# Patient Record
Sex: Male | Born: 1960 | Race: White | Hispanic: No | Marital: Married | Smoking: Former smoker
Health system: Southern US, Community
[De-identification: ages and names within clinical notes are randomized; demographics above are authoritative.]

## PROBLEM LIST (undated history)

## (undated) DIAGNOSIS — R112 Nausea with vomiting, unspecified: Secondary | ICD-10-CM

## (undated) DIAGNOSIS — R0600 Dyspnea, unspecified: Secondary | ICD-10-CM

## (undated) DIAGNOSIS — Z9889 Other specified postprocedural states: Secondary | ICD-10-CM

## (undated) DIAGNOSIS — C801 Malignant (primary) neoplasm, unspecified: Secondary | ICD-10-CM

## (undated) HISTORY — PX: APPENDECTOMY: SHX54

## (undated) HISTORY — PX: HIP SURGERY: SHX245

## (undated) HISTORY — PX: WISDOM TOOTH EXTRACTION: SHX21

## (undated) HISTORY — PX: SHOULDER SURGERY: SHX246

## (undated) HISTORY — PX: INGUINAL HERNIA REPAIR: SHX194

---

## 2019-03-27 ENCOUNTER — Other Ambulatory Visit: Payer: Self-pay

## 2019-03-27 ENCOUNTER — Encounter: Payer: Self-pay | Admitting: Internal Medicine

## 2019-03-27 ENCOUNTER — Ambulatory Visit: Payer: Managed Care, Other (non HMO) | Admitting: Internal Medicine

## 2019-03-27 DIAGNOSIS — C3490 Malignant neoplasm of unspecified part of unspecified bronchus or lung: Secondary | ICD-10-CM

## 2019-03-27 MED ORDER — BREO ELLIPTA 100-25 MCG/INH IN AEPB: 1.0000 | INHALATION_SPRAY | Freq: Every day | RESPIRATORY_TRACT | 0 refills | Status: AC

## 2019-03-27 MED ORDER — CEFAZOLIN SODIUM-DEXTROSE 2-4 GM/100ML-% IV SOLN: 2.0000 g | INTRAVENOUS | Status: DC

## 2019-03-27 NOTE — Patient Instructions (Signed)
COVID Screen Bronchoscopy/biopsy on Tuesday I will call you with results and next steps PET scan ordered, they will call you to schedule

## 2019-03-27 NOTE — H&P (View-Only) (Signed)
[id number]  Synopsis: Referred in 03/27/19 for lung mass by Glenda Chroman, MD  Subjective:   PATIENT ID: George Mullen GENDER: male DOB: [birth year], MRN: [id number]  Chief Complaint  Patient presents with  . Consult    Patient is here for consult for shortness or breath, cough for 3 months and lung mass.    HPI 59 year old smoker presenting with persistent cough and dyspnea x 3 months found to have lung mass on imaging.  Initial trials of steroids and antibiotics ineffective leading to CT scan.  Pulmonology is consulted to assist with workup.  Quit smoking last week, 1.5 ppd x 30 years prior to that  ROS as below.  No B symptoms. Cough is productive of clear sputum, no blood.  Systemic steroids and OTC cough meds don't seem to help it.  ROS Positive Symptoms in bold:  Constitutional fevers, chills, weight loss, fatigue, anorexia, malaise  Eyes decreased vision, double vision, eye irritation  Ears, Nose, Mouth, Throat sore throat, trouble swallowing, sinus congestion  Cardiovascular chest pain, paroxysmal nocturnal dyspnea, lower ext edema, palpitations   Respiratory SOB, cough, DOE, hemoptysis, wheezing  Gastrointestinal nausea, vomiting, diarrhea  Genitourinary burning with urination, trouble urinating  Musculoskeletal joint aches, joint swelling, back pain  Integumentary  rashes, skin lesions  Neurological focal weakness, focal numbness, trouble speaking, headaches  Psychiatric depression, anxiety, confusion  Endocrine polyuria, polydipsia, cold intolerance, heat intolerance  Hematologic abnormal bruising, abnormal bleeding, unexplained nose bleeds  Allergic/Immunologic recurrent infections, hives, swollen lymph nodes    History reviewed. No pertinent past medical history.   Family History  Problem Relation Age of Onset  . COPD Mother   . Lung cancer Maternal Grandmother   . Stomach cancer Paternal Grandmother      Past Surgical History:  Procedure Laterality Date  .  APPENDECTOMY    . HIP SURGERY Right   . INGUINAL HERNIA REPAIR    . SHOULDER SURGERY Right     Social History   Socioeconomic History  . Marital status: Married    Spouse name: Not on file  . Number of children: Not on file  . Years of education: Not on file  . Highest education level: Not on file  Occupational History  . Not on file  Tobacco Use  . Smoking status: Former Smoker    Types: Cigarettes    Quit date: 03/18/2019    Years since quitting: 0.0  . Smokeless tobacco: Never Used  Substance and Sexual Activity  . Alcohol use: Yes    Comment: Rare  . Drug use: Never  . Sexual activity: Not on file  Other Topics Concern  . Not on file  Social History Narrative  . Not on file   Social Determinants of Health   Financial Resource Strain:   . Difficulty of Paying Living Expenses: Not on file  Food Insecurity:   . Worried About Charity fundraiser in the Last Year: Not on file  . Ran Out of Food in the Last Year: Not on file  Transportation Needs:   . Lack of Transportation (Medical): Not on file  . Lack of Transportation (Non-Medical): Not on file  Physical Activity:   . Days of Exercise per Week: Not on file  . Minutes of Exercise per Session: Not on file  Stress:   . Feeling of Stress : Not on file  Social Connections:   . Frequency of Communication with Friends and Family: Not on file  . Frequency of Social  Gatherings with Friends and Family: Not on file  . Attends Religious Services: Not on file  . Active Member of Clubs or Organizations: Not on file  . Attends Archivist Meetings: Not on file  . Marital Status: Not on file  Intimate Partner Violence:   . Fear of Current or Ex-Partner: Not on file  . Emotionally Abused: Not on file  . Physically Abused: Not on file  . Sexually Abused: Not on file     Not on File   No outpatient medications prior to visit.   No facility-administered medications prior to visit.     Objective:  GEN:  middle aged man in NAD HEENT: trachea midline, MMM CV: RRR, ext warm PULM: diminished bilaterally, no wheezing GI: Soft, +BS  EXT: No edema NEURO: Moves all 4 ext to command PSYCH: AOx3, good insight SKIN: no rashes   Vitals:   03/27/19 1136  BP: 120/80  Pulse: 75  Temp: (!) 97.2 F (36.2 C)  TempSrc: Temporal  SpO2: 97%  Weight: 160 lb (72.6 kg)  Height: 5\' 8"  (1.727 m)   97% on RA BMI Readings from Last 3 Encounters:  03/27/19 24.33 kg/m   Wt Readings from Last 3 Encounters:  03/27/19 160 lb (72.6 kg)     CBC No results found for: WBC, RBC, HGB, HCT, PLT, MCV, MCH, MCHC, RDW, LYMPHSABS, MONOABS, EOSABS, BASOSABS     Assessment & Plan:  # LUL masses, 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 TCTS referral depending on above results - Trial of breo for cough  Spent great than 60 minutes on this patient's care greater than 50% of which was spent face-to-face.  All questions answered.  No current outpatient medications on file.   Candee Furbish, MD [facility] 03/27/2019 12:09 PM

## 2019-03-27 NOTE — Progress Notes (Signed)
[id number]  Synopsis: Referred in 03/27/19 for lung mass by Glenda Chroman, MD  Subjective:   PATIENT ID: George Mullen GENDER: male DOB: 07-28-1960, MRN: [id number]  Chief Complaint  Patient presents with  . Consult    Patient is here for consult for shortness or breath, cough for 3 months and lung mass.    HPI 59 year old smoker presenting with persistent cough and dyspnea x 3 months found to have lung mass on imaging.  Initial trials of steroids and antibiotics ineffective leading to CT scan.  Pulmonology is consulted to assist with workup.  Quit smoking last week, 1.5 ppd x 30 years prior to that  ROS as below.  No B symptoms. Cough is productive of clear sputum, no blood.  Systemic steroids and OTC cough meds don't seem to help it.  ROS Positive Symptoms in bold:  Constitutional fevers, chills, weight loss, fatigue, anorexia, malaise  Eyes decreased vision, double vision, eye irritation  Ears, Nose, Mouth, Throat sore throat, trouble swallowing, sinus congestion  Cardiovascular chest pain, paroxysmal nocturnal dyspnea, lower ext edema, palpitations   Respiratory SOB, cough, DOE, hemoptysis, wheezing  Gastrointestinal nausea, vomiting, diarrhea  Genitourinary burning with urination, trouble urinating  Musculoskeletal joint aches, joint swelling, back pain  Integumentary  rashes, skin lesions  Neurological focal weakness, focal numbness, trouble speaking, headaches  Psychiatric depression, anxiety, confusion  Endocrine polyuria, polydipsia, cold intolerance, heat intolerance  Hematologic abnormal bruising, abnormal bleeding, unexplained nose bleeds  Allergic/Immunologic recurrent infections, hives, swollen lymph nodes    History reviewed. No pertinent past medical history.   Family History  Problem Relation Age of Onset  . COPD Mother   . Lung cancer Maternal Grandmother   . Stomach cancer Paternal Grandmother      Past Surgical History:  Procedure Laterality Date  .  APPENDECTOMY    . HIP SURGERY Right   . INGUINAL HERNIA REPAIR    . SHOULDER SURGERY Right     Social History   Socioeconomic History  . Marital status: Married    Spouse name: Not on file  . Number of children: Not on file  . Years of education: Not on file  . Highest education level: Not on file  Occupational History  . Not on file  Tobacco Use  . Smoking status: Former Smoker    Types: Cigarettes    Quit date: 03/18/2019    Years since quitting: 0.0  . Smokeless tobacco: Never Used  Substance and Sexual Activity  . Alcohol use: Yes    Comment: Rare  . Drug use: Never  . Sexual activity: Not on file  Other Topics Concern  . Not on file  Social History Narrative  . Not on file   Social Determinants of Health   Financial Resource Strain:   . Difficulty of Paying Living Expenses: Not on file  Food Insecurity:   . Worried About Charity fundraiser in the Last Year: Not on file  . Ran Out of Food in the Last Year: Not on file  Transportation Needs:   . Lack of Transportation (Medical): Not on file  . Lack of Transportation (Non-Medical): Not on file  Physical Activity:   . Days of Exercise per Week: Not on file  . Minutes of Exercise per Session: Not on file  Stress:   . Feeling of Stress : Not on file  Social Connections:   . Frequency of Communication with Friends and Family: Not on file  . Frequency of Social  Gatherings with Friends and Family: Not on file  . Attends Religious Services: Not on file  . Active Member of Clubs or Organizations: Not on file  . Attends Archivist Meetings: Not on file  . Marital Status: Not on file  Intimate Partner Violence:   . Fear of Current or Ex-Partner: Not on file  . Emotionally Abused: Not on file  . Physically Abused: Not on file  . Sexually Abused: Not on file     Not on File   No outpatient medications prior to visit.   No facility-administered medications prior to visit.     Objective:  GEN:  middle aged man in NAD HEENT: trachea midline, MMM CV: RRR, ext warm PULM: diminished bilaterally, no wheezing GI: Soft, +BS  EXT: No edema NEURO: Moves all 4 ext to command PSYCH: AOx3, good insight SKIN: no rashes   Vitals:   03/27/19 1136  BP: 120/80  Pulse: 75  Temp: (!) 97.2 F (36.2 C)  TempSrc: Temporal  SpO2: 97%  Weight: 160 lb (72.6 kg)  Height: 5\' 8"  (1.727 m)   97% on RA BMI Readings from Last 3 Encounters:  03/27/19 24.33 kg/m   Wt Readings from Last 3 Encounters:  03/27/19 160 lb (72.6 kg)     CBC No results found for: WBC, RBC, HGB, HCT, PLT, MCV, MCH, MCHC, RDW, LYMPHSABS, MONOABS, EOSABS, BASOSABS     Assessment & Plan:  # LUL masses, 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 TCTS referral depending on above results - Trial of breo for cough  Spent great than 60 minutes on this patient's care greater than 50% of which was spent face-to-face.  All questions answered.  No current outpatient medications on file.   Candee Furbish, MD [facility] 03/27/2019 12:09 PM

## 2019-03-29 ENCOUNTER — Other Ambulatory Visit: Payer: Self-pay

## 2019-03-29 ENCOUNTER — Encounter (HOSPITAL_COMMUNITY): Payer: Self-pay | Admitting: Internal Medicine

## 2019-03-29 NOTE — Progress Notes (Signed)
Patient denies shortness of breath, fever, cough and chest pain.  PCP - Dr Dian Situ Dyas Cardiologist - denies  Chest x-ray - denies EKG - 04/02/19 DOS Stress Test - denies ECHO - denies Cardiac Cath - denies  STOP now taking any Aspirin (unless otherwise instructed by your surgeon), Aleve, Naproxen, Ibuprofen, Motrin, Advil, Goody's, BC's, all herbal medications, fish oil, and all vitamins.   Coronavirus Screening Have you experienced the following symptoms:  Cough yes/no: No Fever (>100.70F)  yes/no: No Runny nose yes/no: No Sore throat yes/no: No Difficulty breathing/shortness of breath  yes/no: No  Have you traveled in the last 14 days and where? yes/no: No  Patient verbalized understanding of instructions that were given via phone.

## 2019-03-30 ENCOUNTER — Other Ambulatory Visit (HOSPITAL_COMMUNITY)
Admission: RE | Admit: 2019-03-30 | Discharge: 2019-03-30 | Disposition: A | Discharge status: Home / Self Care | Payer: Managed Care, Other (non HMO) | Source: Ambulatory Visit | Attending: Internal Medicine | Admitting: Internal Medicine

## 2019-03-30 DIAGNOSIS — Z20822 Contact with and (suspected) exposure to covid-19: Secondary | ICD-10-CM | POA: Diagnosis not present

## 2019-03-30 DIAGNOSIS — Z01812 Encounter for preprocedural laboratory examination: Secondary | ICD-10-CM | POA: Insufficient documentation

## 2019-03-31 LAB — SARS CORONAVIRUS 2 (TAT 6-24 HRS): SARS Coronavirus 2: NEGATIVE

## 2019-04-01 NOTE — Anesthesia Preprocedure Evaluation (Addendum)
Anesthesia Evaluation  Patient identified by MRN, date of birth, ID band Patient awake    Reviewed: Allergy & Precautions, NPO status , Patient's Chart, lab work & pertinent test results  History of Anesthesia Complications (+) PONV and history of anesthetic complications  Airway Mallampati: II  TM Distance: >3 FB Neck ROM: Full    Dental  (+) Teeth Intact, Dental Advisory Given   Pulmonary shortness of breath, former smoker,  Lung mass    Pulmonary exam normal breath sounds clear to auscultation       Cardiovascular negative cardio ROS Normal cardiovascular exam Rhythm:Regular Rate:Normal     Neuro/Psych negative neurological ROS  negative psych ROS   GI/Hepatic negative GI ROS, Neg liver ROS,   Endo/Other  negative endocrine ROS  Renal/GU negative Renal ROS     Musculoskeletal negative musculoskeletal ROS (+)   Abdominal   Peds  Hematology negative hematology ROS (+)   Anesthesia Other Findings Day of surgery medications reviewed with the patient.  Reproductive/Obstetrics                           Anesthesia Physical Anesthesia Plan  ASA: III  Anesthesia Plan: General   Post-op Pain Management:    Induction: Intravenous  PONV Risk Score and Plan: 2 and Dexamethasone and Ondansetron  Airway Management Planned: Oral ETT  Additional Equipment:   Intra-op Plan:   Post-operative Plan: Extubation in OR  Informed Consent: I have reviewed the patients History and Physical, chart, labs and discussed the procedure including the risks, benefits and alternatives for the proposed anesthesia with the patient or authorized representative who has indicated his/her understanding and acceptance.     Dental advisory given  Plan Discussed with: CRNA  Anesthesia Plan Comments:        Anesthesia Quick Evaluation

## 2019-04-02 ENCOUNTER — Other Ambulatory Visit: Payer: Self-pay

## 2019-04-02 ENCOUNTER — Telehealth: Payer: Self-pay | Admitting: Pulmonary Disease

## 2019-04-02 ENCOUNTER — Ambulatory Visit (HOSPITAL_COMMUNITY): Payer: Managed Care, Other (non HMO) | Admitting: Certified Registered"

## 2019-04-02 ENCOUNTER — Encounter (HOSPITAL_COMMUNITY)
Admission: RE | Disposition: A | Discharge status: Home / Self Care | Payer: Self-pay | Source: Home / Self Care | Attending: Internal Medicine

## 2019-04-02 ENCOUNTER — Encounter: Payer: Self-pay | Admitting: Pulmonary Disease

## 2019-04-02 ENCOUNTER — Ambulatory Visit (HOSPITAL_COMMUNITY)
Admission: RE | Admit: 2019-04-02 | Discharge: 2019-04-02 | Disposition: A | Discharge status: Home / Self Care | Payer: Managed Care, Other (non HMO) | Attending: Internal Medicine | Admitting: Internal Medicine

## 2019-04-02 ENCOUNTER — Encounter (HOSPITAL_COMMUNITY): Payer: Self-pay | Admitting: Internal Medicine

## 2019-04-02 DIAGNOSIS — C3490 Malignant neoplasm of unspecified part of unspecified bronchus or lung: Secondary | ICD-10-CM

## 2019-04-02 DIAGNOSIS — C3412 Malignant neoplasm of upper lobe, left bronchus or lung: Secondary | ICD-10-CM | POA: Insufficient documentation

## 2019-04-02 DIAGNOSIS — Z87891 Personal history of nicotine dependence: Secondary | ICD-10-CM | POA: Diagnosis not present

## 2019-04-02 DIAGNOSIS — R918 Other nonspecific abnormal finding of lung field: Secondary | ICD-10-CM | POA: Diagnosis present

## 2019-04-02 DIAGNOSIS — Z801 Family history of malignant neoplasm of trachea, bronchus and lung: Secondary | ICD-10-CM | POA: Insufficient documentation

## 2019-04-02 DIAGNOSIS — R05 Cough: Secondary | ICD-10-CM

## 2019-04-02 DIAGNOSIS — C771 Secondary and unspecified malignant neoplasm of intrathoracic lymph nodes: Secondary | ICD-10-CM | POA: Insufficient documentation

## 2019-04-02 DIAGNOSIS — R059 Cough, unspecified: Secondary | ICD-10-CM

## 2019-04-02 HISTORY — PX: BRONCHIAL NEEDLE ASPIRATION BIOPSY: SHX5106

## 2019-04-02 HISTORY — PX: ENDOBRONCHIAL ULTRASOUND: SHX5096

## 2019-04-02 HISTORY — DX: Dyspnea, unspecified: R06.00

## 2019-04-02 HISTORY — DX: Other specified postprocedural states: Z98.890

## 2019-04-02 HISTORY — DX: Other specified postprocedural states: R11.2

## 2019-04-02 HISTORY — PX: VIDEO BRONCHOSCOPY: SHX5072

## 2019-04-02 LAB — COMPREHENSIVE METABOLIC PANEL
ALT: 36 U/L (ref 0–44)
AST: 27 U/L (ref 15–41)
Albumin: 3.4 g/dL — ABNORMAL LOW (ref 3.5–5.0)
Alkaline Phosphatase: 59 U/L (ref 38–126)
Anion gap: 12 (ref 5–15)
BUN: 17 mg/dL (ref 6–20)
CO2: 22 mmol/L (ref 22–32)
Calcium: 9 mg/dL (ref 8.9–10.3)
Chloride: 105 mmol/L (ref 98–111)
Creatinine, Ser: 1.09 mg/dL (ref 0.61–1.24)
GFR calc Af Amer: >60 mL/min (ref >60)
GFR calc non Af Amer: >60 mL/min (ref >60)
Glucose, Bld: 96 mg/dL (ref 70–99)
Potassium: 3.9 mmol/L (ref 3.5–5.1)
Sodium: 139 mmol/L (ref 135–145)
Total Bilirubin: 0.4 mg/dL (ref 0.3–1.2)
Total Protein: 6 g/dL — ABNORMAL LOW (ref 6.5–8.1)

## 2019-04-02 LAB — TYPE AND SCREEN
ABO/RH(D): A POS
Antibody Screen: NEGATIVE

## 2019-04-02 LAB — CBC
HCT: 43.1 % (ref 39.0–52.0)
Hemoglobin: 14.2 g/dL (ref 13.0–17.0)
MCH: 29.8 pg (ref 26.0–34.0)
MCHC: 32.9 g/dL (ref 30.0–36.0)
MCV: 90.5 fL (ref 80.0–100.0)
Platelets: 316 10*3/uL (ref 150–400)
RBC: 4.76 MIL/uL (ref 4.22–5.81)
RDW: 12.4 % (ref 11.5–15.5)
WBC: 8 10*3/uL (ref 4.0–10.5)
nRBC: 0 % (ref 0.0–0.2)

## 2019-04-02 LAB — PROTIME-INR
INR: 1 (ref 0.8–1.2)
Prothrombin Time: 13.2 seconds (ref 11.4–15.2)

## 2019-04-02 LAB — ABO/RH: ABO/RH(D): A POS

## 2019-04-02 LAB — APTT: aPTT: 31 seconds (ref 24–36)

## 2019-04-02 SURGERY — VIDEO BRONCHOSCOPY WITHOUT FLUORO
Anesthesia: General

## 2019-04-02 MED ORDER — PROMETHAZINE HCL 25 MG/ML IJ SOLN: 6.2500 mg | INTRAMUSCULAR | Status: DC | PRN

## 2019-04-02 MED ORDER — LACTATED RINGERS IV SOLN: INTRAVENOUS | Status: DC | PRN

## 2019-04-02 MED ORDER — LIDOCAINE HCL (PF) 1 % IJ SOLN
INTRAMUSCULAR | Status: AC
  Filled 2019-04-02: qty 30

## 2019-04-02 MED ORDER — LIDOCAINE 2% (20 MG/ML) 5 ML SYRINGE
INTRAMUSCULAR | Status: DC | PRN
  Administered 2019-04-02: 100 mg via INTRAVENOUS

## 2019-04-02 MED ORDER — MIDAZOLAM HCL 5 MG/5ML IJ SOLN
INTRAMUSCULAR | Status: DC | PRN
  Administered 2019-04-02: 2 mg via INTRAVENOUS

## 2019-04-02 MED ORDER — LACTATED RINGERS IV SOLN: INTRAVENOUS | Status: DC

## 2019-04-02 MED ORDER — FENTANYL CITRATE (PF) 100 MCG/2ML IJ SOLN
INTRAMUSCULAR | Status: DC | PRN
  Administered 2019-04-02: 100 ug via INTRAVENOUS
  Administered 2019-04-02: 50 ug via INTRAVENOUS

## 2019-04-02 MED ORDER — ACETAMINOPHEN 500 MG PO TABS: 1000.0000 mg | ORAL_TABLET | Freq: Once | ORAL | Status: DC

## 2019-04-02 MED ORDER — PROPOFOL 10 MG/ML IV BOLUS
INTRAVENOUS | Status: DC | PRN
  Administered 2019-04-02: 150 mg via INTRAVENOUS

## 2019-04-02 MED ORDER — HYDROCODONE-HOMATROPINE 5-1.5 MG/5ML PO SYRP: 5.0000 mL | ORAL_SOLUTION | Freq: Four times a day (QID) | ORAL | 0 refills | Status: AC | PRN

## 2019-04-02 MED ORDER — DEXAMETHASONE SODIUM PHOSPHATE 10 MG/ML IJ SOLN
INTRAMUSCULAR | Status: DC | PRN
  Administered 2019-04-02: 10 mg via INTRAVENOUS

## 2019-04-02 MED ORDER — FENTANYL CITRATE (PF) 100 MCG/2ML IJ SOLN: 25.0000 ug | INTRAMUSCULAR | Status: DC | PRN

## 2019-04-02 MED ORDER — ROCURONIUM BROMIDE 50 MG/5ML IV SOSY
PREFILLED_SYRINGE | INTRAVENOUS | Status: DC | PRN
  Administered 2019-04-02: 50 mg via INTRAVENOUS

## 2019-04-02 MED ORDER — ALBUTEROL SULFATE (2.5 MG/3ML) 0.083% IN NEBU: 2.5000 mg | INHALATION_SOLUTION | Freq: Four times a day (QID) | RESPIRATORY_TRACT | 12 refills | Status: AC | PRN

## 2019-04-02 MED ORDER — SUGAMMADEX SODIUM 200 MG/2ML IV SOLN
INTRAVENOUS | Status: DC | PRN
  Administered 2019-04-02: 150 mg via INTRAVENOUS

## 2019-04-02 MED ORDER — ONDANSETRON HCL 4 MG/2ML IJ SOLN
INTRAMUSCULAR | Status: DC | PRN
  Administered 2019-04-02: 4 mg via INTRAVENOUS

## 2019-04-02 NOTE — Op Note (Signed)
Video Bronchoscopy with Endobronchial Ultrasound Procedure Note  Date of Operation: 04/02/2019  Pre-op Diagnosis: Adenopathy, Lung mass  Post-op Diagnosis: Adenopathy, Lung mass  Surgeon: Erskine Emery MD  Anesthesia: General endotracheal anesthesia  Operation: Flexible video fiberoptic bronchoscopy with endobronchial ultrasound and biopsies.  Estimated Blood Loss: Minimal  Complications: None immediate  Indications and History: George Mullen is a 59 y.o. male with lung mass and mediastinal adenopathy.  The risks, benefits, complications, treatment options and expected outcomes were discussed with the patient.  The possibilities of pneumothorax, pneumonia, reaction to medication, pulmonary aspiration, perforation of a viscus, bleeding, failure to diagnose a condition and creating a complication requiring transfusion or operation were discussed with the patient who freely signed the consent.    Description of Procedure: The patient was examined in the preoperative area and history and data from the preprocedure consultation were reviewed. It was deemed appropriate to proceed.  The patient was taken to endoscopy, identified as George Mullen and the procedure verified as Flexible Video Fiberoptic Bronchoscopy.  A Time Out was held and the above information confirmed. After being taken to the operating room general anesthesia was initiated and the patient  was orally intubated. 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   The standard scope was then withdrawn and the endobronchial ultrasound was advanced into airway.   Using real-time ultrasound guidance Wang needle biopsies were take from Station 10R/4R/7 nodes, LUL lung mass and were sent for cytology. The patient tolerated the procedure well without apparent complications. There was no  significant blood loss. The bronchoscope was withdrawn. Anesthesia was reversed and the patient was taken to the PACU for recovery.   Samples: 1. Wang needle biopsies from 10R node 2. Wang needle biopsies from 4R node 3. Wang needle biopsies from 7 node 4. Wang needle biopsies from LUL mass Plans:  The patient will be discharged from the PACU to home when recovered from anesthesia. We will review the cytology, pathology and microbiology results with the patient when they become available. Outpatient followup will be with Dr. Tamala Julian.      Erskine Emery MD [facility] 04/02/2019 8:02 AM Personal pager: [phone] If unanswered, please page CCM On-call: [phone]

## 2019-04-02 NOTE — Transfer of Care (Signed)
Immediate Anesthesia Transfer of Care Note  Patient: George Mullen  Procedure(s) Performed: VIDEO BRONCHOSCOPY WITHOUT FLUORO (N/A ) ENDOBRONCHIAL ULTRASOUND BRONCHIAL NEEDLE ASPIRATION BIOPSIES  Patient Location: PACU  Anesthesia Type:General  Level of Consciousness: awake, alert  and patient cooperative  Airway & Oxygen Therapy: Patient connected to face mask oxygen  Post-op Assessment: Post -op Vital signs reviewed and stable and Patient moving all extremities  Post vital signs: stable  Last Vitals:  Vitals Value Taken Time  BP    Temp    Pulse    Resp    SpO2      Last Pain:  Vitals:   04/02/19 0705  TempSrc:   PainSc: 2       Patients Stated Pain Goal: 4 (94/58/59 2924)  Complications: No apparent anesthesia complications

## 2019-04-02 NOTE — Discharge Instructions (Signed)
I will call you with results and next steps. You may have a slight fever tonight and cough up a little blood. If you are coughing up a lot of blood go to the ER.  Flexible Bronchoscopy, Care After This sheet gives you information about how to care for yourself after your test. Your doctor may also give you more specific instructions. If you have problems or questions, contact your doctor. Follow these instructions at home: Driving  Do not drive for 24 hours if you were given a medicine to help you relax (sedative).  Do not drive or use heavy machinery while taking prescription pain medicine. General instructions   Take over-the-counter and prescription medicines only as told by your doctor.  Return to your normal activities as told. Ask what activities are safe for you.  Do not use any products that have nicotine or tobacco in them. This includes cigarettes and e-cigarettes. If you need help quitting, ask your doctor.  Keep all follow-up visits as told by your doctor. This is important. It is very important if you had a tissue sample (biopsy) taken. Get help right away if:  You have shortness of breath that gets worse.  You get light-headed.  You feel like you are going to pass out (faint).  You have chest pain.  You cough up: ? More than a little blood. ? More blood than before. Summary  Do not eat or drink anything (not even water) for 2 hours after your test, or until your numbing medicine wears off.  Do not use cigarettes. Do not use e-cigarettes.  Get help right away if you have chest pain. This information is not intended to replace advice given to you by your health care provider. Make sure you discuss any questions you have with your health care provider. Document Revised: 02/03/2017 Document Reviewed: 03/11/2016 Elsevier Patient Education  2020 [employer].

## 2019-04-02 NOTE — Interval H&P Note (Signed)
No interim changes, all questions regarding bronchoscopy answered.  Will take a look at small effusion and tap if safe.  History and Physical Interval Note:  04/02/2019 7:57 AM  George Mullen  has presented today for surgery, with the diagnosis of lung mass.  The various methods of treatment have been discussed with the patient and family. After consideration of risks, benefits and other options for treatment, the patient has consented to  Procedure(s): VIDEO BRONCHOSCOPY WITHOUT FLUORO (N/A) ENDOBRONCHIAL ULTRASOUND as a surgical intervention.  The patient's history has been reviewed, patient examined, no change in status, stable for surgery.  I have reviewed the patient's chart and labs.  Questions were answered to the patient's satisfaction.     Candee Furbish

## 2019-04-02 NOTE — Anesthesia Postprocedure Evaluation (Signed)
Anesthesia Post Note  Patient: Duell Holdren  Procedure(s) Performed: VIDEO BRONCHOSCOPY WITHOUT FLUORO (N/A ) ENDOBRONCHIAL ULTRASOUND BRONCHIAL NEEDLE ASPIRATION BIOPSIES     Patient location during evaluation: PACU Anesthesia Type: General Level of consciousness: awake and alert Pain management: pain level controlled Vital Signs Assessment: post-procedure vital signs reviewed and stable Respiratory status: spontaneous breathing, nonlabored ventilation and respiratory function stable Cardiovascular status: blood pressure returned to baseline and stable Postop Assessment: no apparent nausea or vomiting Anesthetic complications: no    Last Vitals:  Vitals:   04/02/19 1049 04/02/19 1050  BP:    Pulse: 98 91  Resp: 19 19  Temp:  (!) 36.1 C  SpO2: 95% 96%    Last Pain:  Vitals:   04/02/19 1050  TempSrc:   PainSc: 0-No pain                 Catalina Gravel

## 2019-04-02 NOTE — Anesthesia Procedure Notes (Signed)
Procedure Name: Intubation Date/Time: 04/02/2019 8:45 AM Performed by: Amadeo Garnet, CRNA Pre-anesthesia Checklist: Patient identified, Emergency Drugs available, Suction available and Patient being monitored Oxygen Delivery Method: Circle system utilized Preoxygenation: Pre-oxygenation with 100% oxygen Laryngoscope Size: Miller and 2 Grade View: Grade I Tube size: 8.5 mm Number of attempts: 1 Placement Confirmation: ETT inserted through vocal cords under direct vision,  positive ETCO2,  CO2 detector and breath sounds checked- equal and bilateral Secured at: 23 cm Tube secured with: Tape Dental Injury: Teeth and Oropharynx as per pre-operative assessment  Comments: Larger ETT needed in order to maintain ventilation during procedure. ETT exchanged via DL with no issues.

## 2019-04-02 NOTE — Telephone Encounter (Signed)
04/02/2019 1221  Before prescribing the patient with Hycodan cough syrup, I have checked [place] PMP aware and the patients overdose risk score is 200. Patient has 2 providers prescribing controlled substances. Patient has used 1 pharmacies.   I have prescribed Hycodan cough syrup.  I discussed this case with Dr. Erskine Emery.  Patient has tolerated Hycodan cough syrup in the past per his [place] PMP chart review.  We will route to Dr. Erskine Emery as Juluis Rainier.  Wyn Quaker, FNP

## 2019-04-02 NOTE — Anesthesia Procedure Notes (Signed)
Procedure Name: Intubation Date/Time: 04/02/2019 8:31 AM Performed by: Amadeo Garnet, CRNA Pre-anesthesia Checklist: Patient identified, Emergency Drugs available, Suction available and Patient being monitored Patient Re-evaluated:Patient Re-evaluated prior to induction Oxygen Delivery Method: Circle system utilized Preoxygenation: Pre-oxygenation with 100% oxygen Induction Type: IV induction Ventilation: Oral airway inserted - appropriate to patient size and Mask ventilation without difficulty Laryngoscope Size: Miller and 2 Grade View: Grade I Tube type: Oral Tube size: 8.0 mm Number of attempts: 1 Airway Equipment and Method: Stylet Placement Confirmation: ETT inserted through vocal cords under direct vision,  positive ETCO2,  CO2 detector and breath sounds checked- equal and bilateral Secured at: 23 cm Tube secured with: Tape Dental Injury: Teeth and Oropharynx as per pre-operative assessment

## 2019-04-03 ENCOUNTER — Encounter: Payer: Self-pay | Admitting: *Deleted

## 2019-04-03 ENCOUNTER — Telehealth: Payer: Self-pay | Admitting: Internal Medicine

## 2019-04-03 NOTE — Telephone Encounter (Signed)
Yes, I will pend order and send tomorrow.   May take 2mg  Valium prior to PET scan and repeat x 1 if needed.

## 2019-04-03 NOTE — Telephone Encounter (Signed)
Spoke with pt's wife, Langley Gauss. Pt is requesting to have a prescription for Valium sent in for him to take prior to his PET scan on Friday. She states that he has taken this before when he was to have imaging done and it worked well.  Beth - please advise due to the PET scan being on Friday and Dr. Tamala Julian not being in the office. Thanks!

## 2019-04-04 ENCOUNTER — Telehealth: Payer: Self-pay | Admitting: Internal Medicine

## 2019-04-04 ENCOUNTER — Ambulatory Visit (INDEPENDENT_AMBULATORY_CARE_PROVIDER_SITE_OTHER): Payer: Managed Care, Other (non HMO) | Admitting: Pulmonary Disease

## 2019-04-04 ENCOUNTER — Telehealth: Payer: Self-pay | Admitting: Pulmonary Disease

## 2019-04-04 ENCOUNTER — Telehealth: Payer: Self-pay | Admitting: Primary Care

## 2019-04-04 ENCOUNTER — Encounter: Payer: Self-pay | Admitting: Pulmonary Disease

## 2019-04-04 DIAGNOSIS — Z87891 Personal history of nicotine dependence: Secondary | ICD-10-CM | POA: Diagnosis not present

## 2019-04-04 DIAGNOSIS — R918 Other nonspecific abnormal finding of lung field: Secondary | ICD-10-CM

## 2019-04-04 DIAGNOSIS — R05 Cough: Secondary | ICD-10-CM | POA: Insufficient documentation

## 2019-04-04 DIAGNOSIS — R059 Cough, unspecified: Secondary | ICD-10-CM

## 2019-04-04 DIAGNOSIS — Z9889 Other specified postprocedural states: Secondary | ICD-10-CM | POA: Diagnosis not present

## 2019-04-04 MED ORDER — DIAZEPAM 2 MG PO TABS: 2.0000 mg | ORAL_TABLET | Freq: Four times a day (QID) | ORAL | 0 refills | Status: DC | PRN

## 2019-04-04 MED ORDER — BENZONATATE 200 MG PO CAPS: 200.0000 mg | ORAL_CAPSULE | Freq: Three times a day (TID) | ORAL | 1 refills | Status: AC | PRN

## 2019-04-04 NOTE — Telephone Encounter (Signed)
Called and spoke to pt's wife, Langley Gauss. She states pt's spO2 is ranging from 88-91% on RA. When pt coughs it will increase to 94% but then quickly decrease to 90%. Pt did a albuterol neb treatment and his sats increased to 95% and was holding there at rest. Pt denies any new symptoms since speaking with Aaron Edelman earlier. Pt's HR was 89bpm and BP was 118/74.   Will forward to Wyn Quaker, NP, to advise further. Thanks.

## 2019-04-04 NOTE — Telephone Encounter (Signed)
Called and spoke with pt's wife Langley Gauss letting her know the info stated by Aaron Edelman. Pt has been scheduled an in-office appt with Aaron Edelman tomorrow at 11:30.nothing further needed.

## 2019-04-04 NOTE — Patient Instructions (Addendum)
You were seen today by Lauraine Rinne, NP  for:   1. Cough  - benzonatate (TESSALON) 200 MG capsule; Take 1 capsule (200 mg total) by mouth 3 (three) times daily as needed for cough.  Dispense: 30 capsule; Refill: 1  Continue use Hycodan cough syrup every 6 hours as needed for cough  I would recommend taking Delsym every 12 hours scheduled  2. S/P bronchoscopy  Continue to monitor vital signs twice daily Notify our office of any acute abnormalities  If you have sudden increased shortness of breath or chest pain please present to the emergency room  3. Abnormal findings on diagnostic imaging of lung  Complete PET scan is scheduled  4. Former smoker  Continue to not smoke   We recommend today:   Meds ordered this encounter  Medications  . benzonatate (TESSALON) 200 MG capsule    Sig: Take 1 capsule (200 mg total) by mouth 3 (three) times daily as needed for cough.    Dispense:  30 capsule    Refill:  1    Follow Up:    Return in about 18 days (around 04/22/2019), or if symptoms worsen or fail to improve, for Follow up with Dr. Tamala Julian.   Please do your part to reduce the spread of COVID-19:      Reduce your risk of any infection  and COVID19 by using the similar precautions used for avoiding the common cold or flu:  Marland Kitchen Wash your hands often with soap and warm water for at least 20 seconds.  If soap and water are not readily available, use an alcohol-based hand sanitizer with at least 60% alcohol.  . If coughing or sneezing, cover your mouth and nose by coughing or sneezing into the elbow areas of your shirt or coat, into a tissue or into your sleeve (not your hands). Langley Gauss A MASK when in public  . Avoid shaking hands with others and consider head nods or verbal greetings only. . Avoid touching your eyes, nose, or mouth with unwashed hands.  . Avoid close contact with people who are sick. . Avoid places or events with large numbers of people in one location, like  concerts or sporting events. . If you have some symptoms but not all symptoms, continue to monitor at home and seek medical attention if your symptoms worsen. . If you are having a medical emergency, call 911.   [place] / e-Visit: [email or web address]         [facility]: [facility]: [phone]                   [facility]: [phone]     It is flu season:   >>> Best ways to protect herself from the flu: Receive the yearly flu vaccine, practice good hand hygiene washing with soap and also using hand sanitizer when available, eat a nutritious meals, get adequate rest, hydrate appropriately   Please contact the office if your symptoms worsen or you have concerns that you are not improving.   Thank you for choosing [facility] for your healthcare, and for allowing [place] to partner with you on your healthcare journey. I am thankful to be able to provide care to you today.   Wyn Quaker FNP-C

## 2019-04-04 NOTE — Assessment & Plan Note (Signed)
Status post bronchoscopy on 04/02/2019  Plan: Complete PET scan on 04/10/2019 Schedule follow-up with Dr. Tamala Julian on 04/22/2019

## 2019-04-04 NOTE — Telephone Encounter (Signed)
As previously discussed with the patient earlier today.  If vital signs are changing then I would recommend an in person evaluation either with primary care, urgent care or our office.  If patient would like he can present to our office for a chest x-ray.  Or patient can be scheduled tomorrow for an in person evaluation.Wyn Quaker, FNP

## 2019-04-04 NOTE — Telephone Encounter (Signed)
Spoke with the pt's spouse  She states pt having increased cough- occ with bloody sputum  Temp 97.7  He feels very fatigued  He is weak and dizzy at times  I moved up his televisit to be with Aaron Edelman today at 04/05/19

## 2019-04-04 NOTE — Assessment & Plan Note (Signed)
Plan: We will schedule follow-up with Dr. Tamala Julian on 04/22/2019 Complete PET scan is scheduled on 04/10/2019 Patient to contact our office if symptoms worsen or if vital signs change and we can see the patient in office with a chest x-ray Encouraged wife who is a CMA to check vital signs: SPO2, blood pressure, heart rate twice daily and notify our office with any abnormalities

## 2019-04-04 NOTE — Telephone Encounter (Signed)
Aaron Edelman came by to let me know he had televisit with pt earlier and he saw phone note where PET had to be rescheduled due to no auth.  He wanted [place] to know he would be available if we need him to do something.  I spoke to [facility] and she has sent insurance company additional records they requested and it is now in nurse review.  I made Aaron Edelman aware.

## 2019-04-04 NOTE — Progress Notes (Signed)
@Patient  ID: George Mullen, male    DOB: 1961-01-05, 59 y.o.   MRN: [id number]  Chief Complaint  Patient presents with  . Follow-up    S/P Bronch     Referring provider: Glenda Chroman, MD  HPI:  59 year old male former smoker consulted with our practice on 03/27/19 for an abnormal CT  Past medical history: anxiety  Smoking history: Former smoker.  Quit January/2021.  45-pack-year smoking history Maintenance: Breo Ellipta 100 Patient of Dr. Tamala Julian  04/05/2019  - Visit   59 year old male former smoker followed in our office for an abnormal CT chest.  He is status post a bronchoscopy on 04/02/2019.  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  Biopsy results are still pending.  Patient completed televisit with our office yesterday reporting concerns over fatigue as well as shortness of breath.  Vital signs are stable over televisit yesterday wife who is a CMA checked.  They contacted our office back later on yesterday afternoon oxygen levels were hovering around 90% per their assessment.  They were scheduled for an in person evaluation today 04/05/2019 with chest x-ray.  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  We are still awaiting insurance approval.  On this.  Patient presenting to our office today reports that symptoms are doing better after taking there breathing treatment.   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  WALK:  SIX MIN WALK 04/05/2019  Supplimental Oxygen during Test? (L/min) No  Tech Comments: fast pace walk, no desats, no SOB or weakness    Imaging: DG Chest 2 View  Result Date: 04/05/2019 CLINICAL DATA:  History of a lung mass. EXAM: CHEST - 2 VIEW COMPARISON:  PA and lateral chest 03/07/2019.  CT chest 03/20/2019. FINDINGS: Left upper lobe mass lesion appears somewhat larger than on the prior plain film. There is a new small left pleural effusion. Lungs are emphysematous. Heart size is normal. No acute bony abnormality. 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 New small left pleural effusion. Emphysema. Electronically Signed   By: Inge Rise M.D.   On: 04/05/2019 11:52    Lab Results:  CBC    Component Value Date/Time   WBC 8.0 04/02/2019 0651   RBC 4.76 04/02/2019 0651   HGB 14.2 04/02/2019 0651   HCT 43.1 04/02/2019 0651   PLT 316 04/02/2019 0651   MCV 90.5 04/02/2019 0651   MCH 29.8 04/02/2019 0651   MCHC 32.9 04/02/2019 0651   RDW 12.4 04/02/2019 0651    BMET    Component Value Date/Time   NA 139 04/02/2019 0651   K 3.9 04/02/2019 0651   CL 105 04/02/2019 0651  CO2 22 04/02/2019 0651   GLUCOSE 96 04/02/2019 0651   BUN 17 04/02/2019 0651   CREATININE 1.09 04/02/2019 0651   CALCIUM 9.0 04/02/2019 0651   GFRNONAA >60 04/02/2019 0651   GFRAA >60 04/02/2019 0651    BNP No results found for: BNP  ProBNP No results found for: PROBNP  Specialty Problems      Pulmonary Problems   Cough      No Known Allergies  Immunization History  Administered Date(s) Administered  . Influenza,inj,Quad PF,6-35 Mos 12/25/2018    Past Medical History:  Diagnosis Date  .  Dyspnea    with exertion  . PONV (postoperative nausea and vomiting)     Tobacco History: Social History   Tobacco Use  Smoking Status Former Smoker  . Packs/day: 1.50  . Years: 45.00  . Pack years: 67.50  . Types: Cigarettes  . Quit date: 03/18/2019  . Years since quitting: 0.0  Smokeless Tobacco Never Used   Counseling given: Yes   Continue to not smoke  Outpatient Encounter Medications as of 04/05/2019  Medication Sig  . acetaminophen (TYLENOL) 500 MG tablet Take 500 mg by mouth every 6 (six) hours as needed.  Marland Kitchen albuterol (PROVENTIL) (2.5 MG/3ML) 0.083% nebulizer solution Take 3 mLs (2.5 mg total) by nebulization every 6 (six) hours as needed for wheezing or shortness of breath.  . benzonatate (TESSALON) 200 MG capsule Take 1 capsule (200 mg total) by mouth 3 (three) times daily as needed for cough.  . diazepam (VALIUM) 2 MG tablet Take 1 tablet (2 mg total) by mouth every 6 (six) hours as needed for anxiety (Prior to PET scan; may repeat x1 if needed).  . fluticasone furoate-vilanterol (BREO ELLIPTA) 100-25 MCG/INH AEPB Inhale 1 puff into the lungs daily.  . fluticasone furoate-vilanterol (BREO ELLIPTA) 100-25 MCG/INH AEPB Inhale 1 puff into the lungs daily.  Marland Kitchen HYDROcodone-homatropine (HYCODAN) 5-1.5 MG/5ML syrup Take 5 mLs by mouth every 6 (six) hours as needed for cough.   No facility-administered encounter medications on file as of 04/05/2019.     Review of Systems  Review of Systems  Constitutional: Positive for fatigue. Negative for activity change, chills, fever and unexpected weight change.  HENT: Negative for postnasal drip, rhinorrhea, sinus pressure, sinus pain and sore throat.   Eyes: Negative.   Respiratory: Positive for cough. Negative for shortness of breath and wheezing.   Cardiovascular: Negative for chest pain and palpitations.  Gastrointestinal: Negative for constipation, diarrhea, nausea and vomiting.  Endocrine: Negative.   Genitourinary: Negative.    Musculoskeletal: Negative.   Skin: Negative.   Neurological: Negative for dizziness and headaches.  Psychiatric/Behavioral: Negative.  Negative for dysphoric mood. The patient is not nervous/anxious.   All other systems reviewed and are negative.    Physical Exam  BP 108/66 (BP Location: Right Arm, Cuff Size: Normal)   Pulse 91   Temp 97.6 F (36.4 C) (Temporal)   Ht 5\' 8"  (1.727 m)   Wt 155 lb 12.8 oz (70.7 kg)   SpO2 95% Comment: RA  BMI 23.69 kg/m   Wt Readings from Last 5 Encounters:  04/05/19 155 lb 12.8 oz (70.7 kg)  04/02/19 160 lb (72.6 kg)  03/27/19 160 lb (72.6 kg)    BMI Readings from Last 5 Encounters:  04/05/19 23.69 kg/m  04/02/19 24.33 kg/m  03/27/19 24.33 kg/m     Physical Exam Vitals and nursing note reviewed.  Constitutional:      General: He is not in acute distress.  Appearance: Normal appearance. He is normal weight.  HENT:     Head: Normocephalic and atraumatic.     Right Ear: Hearing and external ear normal.     Left Ear: Hearing and external ear normal.     Nose: Nose normal. No mucosal edema or rhinorrhea.     Right Turbinates: Not enlarged.     Left Turbinates: Not enlarged.     Mouth/Throat:     Mouth: Mucous membranes are dry.     Pharynx: Oropharynx is clear. No oropharyngeal exudate.  Eyes:     Pupils: Pupils are equal, round, and reactive to light.  Cardiovascular:     Rate and Rhythm: Normal rate and regular rhythm.     Pulses: Normal pulses.     Heart sounds: Normal heart sounds. No murmur.  Pulmonary:     Effort: Pulmonary effort is normal.     Breath sounds: Normal breath sounds. No decreased breath sounds, wheezing or rales.  Musculoskeletal:     Cervical back: Normal range of motion.     Right lower leg: No edema.     Left lower leg: No edema.  Lymphadenopathy:     Cervical: No cervical adenopathy.  Skin:    General: Skin is warm and dry.     Capillary Refill: Capillary refill takes less than 2 seconds.      Findings: No erythema or rash.  Neurological:     General: No focal deficit present.     Mental Status: He is alert and oriented to person, place, and time.     Motor: No weakness.     Coordination: Coordination normal.     Gait: Gait is intact. Gait (Tolerated walk in office) normal.  Psychiatric:        Mood and Affect: Mood normal.        Behavior: Behavior normal. Behavior is cooperative.        Thought Content: Thought content normal.        Judgment: Judgment normal.       Assessment & Plan:   Abnormal findings on diagnostic imaging of lung Status post bronchoscopy on 04/02/2019 Biopsy results still pending PET scan planned for 04/10/2019 Chest x-ray today stable showing left upper lobe mass, small left pleural effusion Discussed chest x-ray with Dr. Candy Sledge walk in office with no oxygen desaturations  Plan: Complete PET scan as scheduled on 04/10/2019 >>> Personally spoke with insurance RN [place] regarding patient's case as PET scan still has not been approved by the insurance company.  They have marked the case urgent.  They reporting that there was a hold-up because they are waiting biopsy results.  I emphasized that I do not believe this is necessary and that we can proceed forward with the PET scan based off patient's smoking history, and evaluation via bronchoscopy, abnormal chest x-ray, as well as abnormal CT chest. Schedule follow-up with Dr. Tamala Julian on 04/22/2019  Cough Persistent hacking cough Directly related to endobronchial lesion seen on CT  Plan: Hycodan cough syrup, reviewed the patient can use every 6 hours We will have patient start taking Delsym scheduled every 12 hours Encourage patient to take Tessalon Perles every 8 hours Emphasized importance of cough suppression  Former smoker Plan: Continue to not smoke  S/P bronchoscopy Vital signs stable today Chest x-ray stable today Walk today in office stable  Plan: We will continue to  clinically monitor    Return in about 17 days (around 04/22/2019), or if symptoms worsen  or fail to improve, for Follow up with Dr. Tamala Julian.   Lauraine Rinne, NP 04/05/2019   This appointment required 42 minutes of patient care (this includes precharting, chart review, review of results, face-to-face care, etc.).

## 2019-04-04 NOTE — Progress Notes (Signed)
Virtual Visit via Telephone Note  I connected with George Mullen on 04/04/19 at  4:30 PM EST by telephone and verified that I am speaking with the correct person using two identifiers.  Location: Patient: Home Provider: [facility] - [address], [place], [place], [place] [address]   I discussed the limitations, risks, security and privacy concerns of performing an evaluation and management service by telephone and the availability of in person appointments. I also discussed with the patient that there may be a patient responsible charge related to this service. The patient expressed understanding and agreed to proceed.  Patient consented to consult via telephone: Yes People present and their role in pt care: Pt   History of Present Illness:  59 year old male former smoker  Past medical history: Smoking history: Former smoker.  Quit January/2021.  45-pack-year smoking history Maintenance: Adair Patter  Patient of Dr. Tamala Julian  Chief complaint:    59 year old male former smoker initially consulted with our practice on 03/27/2019 for evaluation of a lung mass.  Patient underwent a bronchoscopy on 04/02/2019 that showed an endobronchial tumor encroaching on the entrance of the left upper lobe partially obstructing the lobe.  Tissue biopsies are still pending.  Patient has an upcoming PET scan on 04/10/2019.  This was rescheduled due to insurance.  We will have our patient care coordinators follow-up on this.  Patient contacted our office today on 04/04/2019 to report that he is having occasional lightheadedness, woozy-like symptoms, and a hacking persistent cough.  He also feels that his lethargy is worsened.  Cough and lethargy/fatigue has been an ongoing symptom for the last 2 to 3 months.  Wife is a CMA and obtain vital signs and reports the patient's capillary refill is within 2 to 3 seconds.  Vital signs are listed below.  Patient does not feel that Tessalon Perles or  over-the-counter cough medicines do help.  He obtained the narcotic cough syrup that we sent in on 04/02/2019 yesterday and started taking it.  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 Packs/day: 1.50  . Years: 45.00  . Pack years: 67.50  . Types: Cigarettes  . Quit date: 03/18/2019  . Years since quitting: 0.0  Smokeless Tobacco Never Used   Immunization History  Administered Date(s) Administered  . Influenza,inj,Quad PF,6-35 Mos 12/25/2018    Assessment and Plan:  Abnormal findings on diagnostic imaging of lung Status post bronchoscopy on 04/02/2019  Plan: Complete PET scan on 04/10/2019 Schedule follow-up with Dr. Tamala Julian on 04/22/2019  Cough Persistent hacking cough Directly related to endobronchial lesion seen on CT  Plan: Hycodan cough syrup, reviewed the patient can use every 6 hours We will have patient start taking Delsym scheduled every 12 hours Encourage patient to take Tessalon Perles every 8 hours Emphasized importance of cough suppression  Former smoker Plan: Continue to not smoke  S/P bronchoscopy Plan: We will schedule follow-up with Dr. Tamala Julian on 04/22/2019 Complete PET scan is scheduled on 04/10/2019 Patient to contact our office if symptoms worsen or if vital signs change and we can see the patient in office with a chest x-ray Encouraged wife who is a CMA to check vital signs: SPO2, blood pressure, heart rate twice daily and notify our  office with any abnormalities   Follow Up Instructions:  Return in about 18 days (around 04/22/2019), or if symptoms worsen or fail to improve, for Follow up with Dr. Tamala Julian.   I discussed the assessment and treatment plan with the patient. The patient was provided an opportunity to  ask questions and all were answered. The patient agreed with the plan and demonstrated an understanding of the instructions.   The patient was advised to call back or seek an in-person evaluation if the symptoms worsen or if the condition fails to improve as anticipated.  I provided 23 minutes of non-face-to-face time during this encounter.   Lauraine Rinne, NP

## 2019-04-04 NOTE — Assessment & Plan Note (Signed)
Plan: Continue to not smoke 

## 2019-04-04 NOTE — Assessment & Plan Note (Signed)
Persistent hacking cough Directly related to endobronchial lesion seen on CT  Plan: Hycodan cough syrup, reviewed the patient can use every 6 hours We will have patient start taking Delsym scheduled every 12 hours Encourage patient to take Tessalon Perles every 8 hours Emphasized importance of cough suppression

## 2019-04-05 ENCOUNTER — Encounter: Payer: Self-pay | Admitting: Pulmonary Disease

## 2019-04-05 ENCOUNTER — Telehealth: Payer: Self-pay | Admitting: Internal Medicine

## 2019-04-05 ENCOUNTER — Encounter (HOSPITAL_COMMUNITY): Payer: Managed Care, Other (non HMO)

## 2019-04-05 ENCOUNTER — Ambulatory Visit (INDEPENDENT_AMBULATORY_CARE_PROVIDER_SITE_OTHER): Payer: Managed Care, Other (non HMO)

## 2019-04-05 ENCOUNTER — Ambulatory Visit: Payer: Managed Care, Other (non HMO) | Admitting: Pulmonary Disease

## 2019-04-05 ENCOUNTER — Other Ambulatory Visit: Payer: Self-pay

## 2019-04-05 ENCOUNTER — Ambulatory Visit: Payer: Managed Care, Other (non HMO) | Admitting: Primary Care

## 2019-04-05 VITALS — BP 108/66 | HR 91 | Temp 97.6°F | Ht 68.0 in | Wt 155.8 lb

## 2019-04-05 DIAGNOSIS — R05 Cough: Secondary | ICD-10-CM

## 2019-04-05 DIAGNOSIS — Z9889 Other specified postprocedural states: Secondary | ICD-10-CM | POA: Diagnosis not present

## 2019-04-05 DIAGNOSIS — Z87891 Personal history of nicotine dependence: Secondary | ICD-10-CM | POA: Diagnosis not present

## 2019-04-05 DIAGNOSIS — R918 Other nonspecific abnormal finding of lung field: Secondary | ICD-10-CM | POA: Diagnosis not present

## 2019-04-05 DIAGNOSIS — C3490 Malignant neoplasm of unspecified part of unspecified bronchus or lung: Secondary | ICD-10-CM | POA: Diagnosis not present

## 2019-04-05 DIAGNOSIS — R059 Cough, unspecified: Secondary | ICD-10-CM

## 2019-04-05 MED ORDER — BREO ELLIPTA 100-25 MCG/INH IN AEPB: 1.0000 | INHALATION_SPRAY | Freq: Every day | RESPIRATORY_TRACT | 0 refills | Status: DC

## 2019-04-05 NOTE — Telephone Encounter (Signed)
Called and updated patient. 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 Restaining being done. Presumably the final report of the path needs to be completed before PET can be approved but NP Warner Mccreedy to discuss with insurance company.  Erskine Emery MD PCCM

## 2019-04-05 NOTE — Assessment & Plan Note (Signed)
Persistent hacking cough Directly related to endobronchial lesion seen on CT  Plan: Hycodan cough syrup, reviewed the patient can use every 6 hours We will have patient start taking Delsym scheduled every 12 hours Encourage patient to take Tessalon Perles every 8 hours Emphasized importance of cough suppression

## 2019-04-05 NOTE — Assessment & Plan Note (Signed)
Vital signs stable today Chest x-ray stable today Walk today in office stable  Plan: We will continue to clinically monitor

## 2019-04-05 NOTE — Telephone Encounter (Signed)
Will await response from the insurance company.

## 2019-04-05 NOTE — Assessment & Plan Note (Addendum)
Status post bronchoscopy on 04/02/2019 Biopsy results still pending PET scan planned for 04/10/2019 Chest x-ray today stable showing left upper lobe mass, small left pleural effusion Discussed chest x-ray with Dr. Candy Sledge walk in office with no oxygen desaturations  Plan: Complete PET scan as scheduled on 04/10/2019 >>> Personally spoke with insurance RN [place] regarding patient's case as PET scan still has not been approved by the insurance company.  They have marked the case urgent.  They reporting that there was a hold-up because they are waiting biopsy results.  I emphasized that I do not believe this is necessary and that we can proceed forward with the PET scan based off patient's smoking history, and evaluation via bronchoscopy, abnormal chest x-ray, as well as abnormal CT chest. Schedule follow-up with Dr. Tamala Julian on 04/22/2019

## 2019-04-05 NOTE — Patient Instructions (Addendum)
You were seen today by Lauraine Rinne, NP  for:   1. Abnormal findings on diagnostic imaging of lung  - DG Chest 2 View; Future  2. S/P bronchoscopy  3. Malignant neoplasm of unspecified part of unspecified bronchus or lung ([place])  4. Former smoker  We have followed up directly with your insurance company.  He is still pending review.  We spoke with Rosebud Poles  RN and she is forwarding this to an MD to review.  We will have an answer from your insurance company within the next 4 hours per [employer].  Continue to not smoke  Continue to work on cough suppression  Okay to continue Hycodan cough syrup Tessalon Perles every 8 hours Delsym during the day  Your walk today in office went well  We have provided a letter okay new to return to work if you are working reduced hours and management has the understanding that you may need to leave early based off of your symptoms  I have reviewed your chest x-ray with Dr. Tamala Julian, we will continue to monitor this clinically   We recommend today:  Orders Placed This Encounter  Procedures  . DG Chest 2 View    Standing Status:   Future    Number of Occurrences:   1    Standing Expiration Date:   06/02/2020    Order Specific Question:   Reason for Exam (SYMPTOM  OR DIAGNOSIS REQUIRED)    Answer:   lung mass    Order Specific Question:   Preferred imaging location?    Answer:   Internal    Order Specific Question:   Radiology Contrast Protocol - do NOT remove file path    Answer:   [email or web address]   Orders Placed This Encounter  Procedures  . DG Chest 2 View   No orders of the defined types were placed in this encounter.   Follow Up:    Return in about 17 days (around 04/22/2019), or if symptoms worsen or fail to improve, for Follow up with Dr. Tamala Julian.   Please do your part to reduce the spread of COVID-19:      Reduce your risk of any infection  and COVID19 by using the similar precautions  used for avoiding the common cold or flu:  Marland Kitchen Wash your hands often with soap and warm water for at least 20 seconds.  If soap and water are not readily available, use an alcohol-based hand sanitizer with at least 60% alcohol.  . If coughing or sneezing, cover your mouth and nose by coughing or sneezing into the elbow areas of your shirt or coat, into a tissue or into your sleeve (not your hands). Langley Gauss A MASK when in public  . Avoid shaking hands with others and consider head nods or verbal greetings only. . Avoid touching your eyes, nose, or mouth with unwashed hands.  . Avoid close contact with people who are sick. . Avoid places or events with large numbers of people in one location, like concerts or sporting events. . If you have some symptoms but not all symptoms, continue to monitor at home and seek medical attention if your symptoms worsen. . If you are having a medical emergency, call 911.   [facility] / e-Visit: [email or web address]         [facility]: [facility]: [phone]  [facility]: [phone]     It is flu season:   >>> Best ways to protect herself from the flu: Receive the yearly flu vaccine, practice good hand hygiene washing with soap and also using hand sanitizer when available, eat a nutritious meals, get adequate rest, hydrate appropriately   Please contact the office if your symptoms worsen or you have concerns that you are not improving.   Thank you for choosing [facility] for your healthcare, and for allowing [place] to partner with you on your healthcare journey. I am thankful to be able to provide care to you today.   Wyn Quaker FNP-C

## 2019-04-05 NOTE — Telephone Encounter (Signed)
04/05/19 1610  At 2:50 PM received update that PET scan have been authorized.  4 PM received update in portal that this is been benign.  Contacted insurance to complete peer-to-peer.  Case #[id number].  Spoke with Chinita Pester.  PET scan denied due to not having biopsy results.  Emphasized again that we have an abnormal CT, abnormal x-ray as well as with a smoking history.  Pending biopsy results.  Peer-to-peer scheduled for 04/07/2018 at 0930am with Dr Mallie Snooks.  I have updated Dr. Tamala Julian.  He reports that he will contact path for update.   Wyn Quaker FNP

## 2019-04-05 NOTE — Assessment & Plan Note (Signed)
Plan: Continue to not smoke 

## 2019-04-08 ENCOUNTER — Telehealth: Payer: Self-pay | Admitting: Pulmonary Disease

## 2019-04-08 ENCOUNTER — Encounter: Payer: Self-pay | Admitting: Pulmonary Disease

## 2019-04-08 LAB — CYTOLOGY - NON PAP

## 2019-04-08 NOTE — Telephone Encounter (Signed)
04/08/19 0932  04/05/19 - Telephone note - Wyn Quaker FNP   04/05/19 1610  At 2:50 PM received update that PET scan have been authorized.  4 PM received update in portal that this is been denied.  Contacted insurance to complete peer-to-peer.  Case #[id number].  Spoke with Chinita Pester.  PET scan denied due to not having biopsy results.  Emphasized again that we have an abnormal CT, abnormal x-ray as well as with a smoking history.  Pending biopsy results.  Peer-to-peer scheduled for 04/07/2018 at 0930am with Dr Mallie Snooks.  I have updated Dr. Tamala Julian.  He reports that he will contact path for update.   Wyn Quaker FNP   04/05/19 - Telephone Note - Dr. Rocky Morel and updated patient. 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 Restaining being done. Presumably the final report of the path needs to be completed before PET can be approved but NP Warner Mccreedy to discuss with insurance company.  Erskine Emery MD PCCM    04/08/19 - [phone]   PEER to PEER - Dr. Ladona Mow   Approval# - [id number]   Peer-to-peer has been completed.  It has been approved.  We will route to Dr. Tamala Julian as Juluis Rainier.  We will also route to PCC's as FYI.  Wyn Quaker FNP

## 2019-04-08 NOTE — Telephone Encounter (Signed)
Contacted patient and updated them regarding PET scan approval.  George Quaker, FNP

## 2019-04-10 ENCOUNTER — Other Ambulatory Visit: Payer: Self-pay

## 2019-04-10 ENCOUNTER — Encounter (HOSPITAL_COMMUNITY)
Admission: RE | Admit: 2019-04-10 | Discharge: 2019-04-10 | Disposition: A | Discharge status: Home / Self Care | Payer: Managed Care, Other (non HMO) | Source: Ambulatory Visit | Attending: Internal Medicine | Admitting: Internal Medicine

## 2019-04-10 DIAGNOSIS — C3490 Malignant neoplasm of unspecified part of unspecified bronchus or lung: Secondary | ICD-10-CM | POA: Diagnosis not present

## 2019-04-10 LAB — GLUCOSE, CAPILLARY: Glucose-Capillary: 106 mg/dL — ABNORMAL HIGH (ref 70–99)

## 2019-04-10 MED ORDER — FLUDEOXYGLUCOSE F - 18 (FDG) INJECTION
7.7300 | Freq: Once | INTRAVENOUS | Status: AC | PRN
  Administered 2019-04-10: 7.73 via INTRAVENOUS

## 2019-04-11 ENCOUNTER — Other Ambulatory Visit: Payer: Self-pay

## 2019-04-11 ENCOUNTER — Telehealth: Payer: Self-pay | Admitting: Internal Medicine

## 2019-04-11 ENCOUNTER — Emergency Department (HOSPITAL_COMMUNITY)
Admission: EM | Admit: 2019-04-11 | Discharge: 2019-04-11 | Disposition: A | Discharge status: Home / Self Care | Payer: Managed Care, Other (non HMO) | Attending: Emergency Medicine | Admitting: Emergency Medicine

## 2019-04-11 ENCOUNTER — Encounter (HOSPITAL_COMMUNITY): Payer: Self-pay

## 2019-04-11 ENCOUNTER — Other Ambulatory Visit: Payer: Self-pay | Admitting: *Deleted

## 2019-04-11 ENCOUNTER — Emergency Department (HOSPITAL_COMMUNITY): Payer: Managed Care, Other (non HMO)

## 2019-04-11 DIAGNOSIS — R519 Headache, unspecified: Secondary | ICD-10-CM | POA: Insufficient documentation

## 2019-04-11 DIAGNOSIS — C3492 Malignant neoplasm of unspecified part of left bronchus or lung: Secondary | ICD-10-CM | POA: Diagnosis not present

## 2019-04-11 DIAGNOSIS — Z87891 Personal history of nicotine dependence: Secondary | ICD-10-CM | POA: Insufficient documentation

## 2019-04-11 DIAGNOSIS — Z85118 Personal history of other malignant neoplasm of bronchus and lung: Secondary | ICD-10-CM | POA: Insufficient documentation

## 2019-04-11 DIAGNOSIS — R630 Anorexia: Secondary | ICD-10-CM | POA: Insufficient documentation

## 2019-04-11 DIAGNOSIS — R5383 Other fatigue: Secondary | ICD-10-CM | POA: Diagnosis not present

## 2019-04-11 DIAGNOSIS — C7951 Secondary malignant neoplasm of bone: Secondary | ICD-10-CM | POA: Diagnosis not present

## 2019-04-11 DIAGNOSIS — C781 Secondary malignant neoplasm of mediastinum: Secondary | ICD-10-CM | POA: Insufficient documentation

## 2019-04-11 DIAGNOSIS — G4459 Other complicated headache syndrome: Secondary | ICD-10-CM

## 2019-04-11 DIAGNOSIS — J449 Chronic obstructive pulmonary disease, unspecified: Secondary | ICD-10-CM | POA: Diagnosis not present

## 2019-04-11 DIAGNOSIS — Z79899 Other long term (current) drug therapy: Secondary | ICD-10-CM | POA: Insufficient documentation

## 2019-04-11 HISTORY — DX: Malignant (primary) neoplasm, unspecified: C80.1

## 2019-04-11 LAB — BASIC METABOLIC PANEL
Anion gap: 11 (ref 5–15)
BUN: 15 mg/dL (ref 6–20)
CO2: 26 mmol/L (ref 22–32)
Calcium: 9.1 mg/dL (ref 8.9–10.3)
Chloride: 99 mmol/L (ref 98–111)
Creatinine, Ser: 1.2 mg/dL (ref 0.61–1.24)
GFR calc Af Amer: >60 mL/min (ref >60)
GFR calc non Af Amer: >60 mL/min (ref >60)
Glucose, Bld: 101 mg/dL — ABNORMAL HIGH (ref 70–99)
Potassium: 4.4 mmol/L (ref 3.5–5.1)
Sodium: 136 mmol/L (ref 135–145)

## 2019-04-11 LAB — CBC
HCT: 46.6 % (ref 39.0–52.0)
Hemoglobin: 15.1 g/dL (ref 13.0–17.0)
MCH: 29.8 pg (ref 26.0–34.0)
MCHC: 32.4 g/dL (ref 30.0–36.0)
MCV: 92.1 fL (ref 80.0–100.0)
Platelets: 441 10*3/uL — ABNORMAL HIGH (ref 150–400)
RBC: 5.06 MIL/uL (ref 4.22–5.81)
RDW: 12.5 % (ref 11.5–15.5)
WBC: 9.4 10*3/uL (ref 4.0–10.5)
nRBC: 0 % (ref 0.0–0.2)

## 2019-04-11 MED ORDER — OXYCODONE-ACETAMINOPHEN 5-325 MG PO TABS: 2.0000 | ORAL_TABLET | Freq: Four times a day (QID) | ORAL | 0 refills | Status: AC | PRN

## 2019-04-11 MED ORDER — OXYCODONE-ACETAMINOPHEN 5-325 MG PO TABS
1.0000 | ORAL_TABLET | ORAL | Status: DC | PRN
  Filled 2019-04-11 (×2): qty 1

## 2019-04-11 MED ORDER — PREDNISONE 20 MG PO TABS: 40.0000 mg | ORAL_TABLET | Freq: Every day | ORAL | 0 refills | Status: AC

## 2019-04-11 MED ORDER — PREDNISONE 20 MG PO TABS
40.0000 mg | ORAL_TABLET | Freq: Every day | ORAL | Status: DC
  Administered 2019-04-11: 40 mg via ORAL
  Filled 2019-04-11: qty 2

## 2019-04-11 MED ORDER — ONDANSETRON 4 MG PO TBDP: 4.0000 mg | ORAL_TABLET | Freq: Three times a day (TID) | ORAL | 0 refills | Status: AC | PRN

## 2019-04-11 MED ORDER — DIAZEPAM 5 MG PO TABS
5.0000 mg | ORAL_TABLET | Freq: Once | ORAL | Status: AC | PRN
  Administered 2019-04-11: 5 mg via ORAL
  Filled 2019-04-11: qty 1

## 2019-04-11 MED ORDER — LORAZEPAM 2 MG/ML IJ SOLN
INTRAMUSCULAR | Status: AC
  Filled 2019-04-11: qty 1

## 2019-04-11 MED ORDER — LORAZEPAM 2 MG/ML IJ SOLN
2.0000 mg | Freq: Once | INTRAMUSCULAR | Status: AC
  Administered 2019-04-11: 2 mg via INTRAVENOUS

## 2019-04-11 MED ORDER — GADOBUTROL 1 MMOL/ML IV SOLN
7.0000 mL | Freq: Once | INTRAVENOUS | Status: AC | PRN
  Administered 2019-04-11: 7 mL via INTRAVENOUS

## 2019-04-11 NOTE — Telephone Encounter (Signed)
Based on the information stated above I believe the patient needs to seek in person evaluation or present to an emergency room.  I am concerned that if he is not eating because of worsening fatigue that he at least needs baseline lab work and further evaluation.I have also CCed Dr. Tamala Julian on this.The lethargy is what is most concerning.  Especially without baseline vital signs.  His wife is a CMA is she able to check his vital signs?I am more than happy to sign a note saying the patient has still not been able to work.  That is perfectly okay.Wyn Quaker, FNP

## 2019-04-11 NOTE — Telephone Encounter (Signed)
Routing to triage as FYI.  Wyn Quaker, FNP

## 2019-04-11 NOTE — ED Triage Notes (Signed)
Pt sent by pulmonologist for evaluation of generalized fatigue, left sided headache for the past 4-5 days. Pt unable to eat or drink much, no appetite. Hx of lung cancer. Pt coughing in triage. Pt a.o.

## 2019-04-11 NOTE — Telephone Encounter (Signed)
Called patient and wife He will come to ER with her Will call and give heads up to ER charge nurse No further action needed at this time  Thanks

## 2019-04-11 NOTE — ED Provider Notes (Signed)
[facility] [facility] EMERGENCY DEPARTMENT Provider Note   CSN: [id number] Arrival date & time: 04/11/19  1552     History Chief Complaint  Patient presents with  . Headache  . Fatigue    George Mullen is a 59 y.o. male with past medical history significant for recent discovery of lung cancer that is poorly differentiated with mets to bone and mediastinum presenting to emergency department today with chief complaint of left sided headache x3 days.  Patient states his headache has progressively worsened since onset. He has history of headaches, but this one is nagging more than usual.  He is endorsing associated pressure feeling behind his left eye that is intermittent, has worsened over the last 2 days.  Patient also has worsening fatigue in the last several weeks and has had decreased p.o. intake because of that.  Patient states he has had a cough for the last several months, he is currently taking Tessalon Perles and Tussionex for his cough.  He last took medicine just prior to arrival.  Patient under care of pulmonologist Dr. Tamala Julian.  Dr. Tamala Julian spoke with patient and wife on the phone earlier this afternoon and recommended they come to emergency department for treatment and MRI brain to assess for possible mets.  He denies any fever, chills, blurry vision, neck pain, rash, chest pain, abdominal pain, nausea, vomiting, urinary symptoms, diarrhea. Denies any sick contacts or contact with anyone known positive for COVID-19.  Patient has not yet started chemotherapy or radiation, his case was recently presented to the tumor board.  Past Medical History:  Diagnosis Date  . Cancer ([place])    lung cancer  . Dyspnea    with exertion  . PONV (postoperative nausea and vomiting)     Patient Active Problem List   Diagnosis Date Noted  . S/P bronchoscopy 04/04/2019  . Cough 04/04/2019  . Abnormal findings on diagnostic imaging of lung 04/04/2019  . Former smoker 04/04/2019    Past  Surgical History:  Procedure Laterality Date  . APPENDECTOMY    . BRONCHIAL NEEDLE ASPIRATION BIOPSY  04/02/2019   Procedure: BRONCHIAL NEEDLE ASPIRATION BIOPSIES;  Surgeon: Candee Furbish, MD;  Location[facility] ENDOSCOPY;  Service: Endoscopy;;  . ENDOBRONCHIAL ULTRASOUND  04/02/2019   Procedure: ENDOBRONCHIAL ULTRASOUND;  Surgeon: Candee Furbish, MD;  Location: [facility];  Service: Endoscopy;;  . HIP SURGERY Right   . INGUINAL HERNIA REPAIR    . SHOULDER SURGERY Right   . VIDEO BRONCHOSCOPY N/A 04/02/2019   Procedure: VIDEO BRONCHOSCOPY WITHOUT FLUORO;  Surgeon: Candee Furbish, MD;  Location: [facility] ENDOSCOPY;  Service: Endoscopy;  Laterality: N/A;  . WISDOM TOOTH EXTRACTION         Family History  Problem Relation Age of Onset  . COPD Mother   . Lung cancer Maternal Grandmother   . Stomach cancer Paternal Grandmother     Social History   Tobacco Use  . Smoking status: Former Smoker    Packs/day: 1.50    Years: 45.00    Pack years: 67.50    Types: Cigarettes    Quit date: 03/18/2019    Years since quitting: 0.0  . Smokeless tobacco: Never Used  Substance Use Topics  . Alcohol use: Yes    Comment: Occasional Beer  . Drug use: Never    Home Medications Prior to Admission medications   Medication Sig Start Date End Date Taking? Authorizing Provider  acetaminophen (TYLENOL) 500 MG tablet Take 500 mg by mouth every 6 (six)  hours as needed.    [provider]  albuterol (PROVENTIL) (2.5 MG/3ML) 0.083% nebulizer solution Take 3 mLs (2.5 mg total) by nebulization every 6 (six) hours as needed for wheezing or shortness of breath. 04/02/19   Candee Furbish, MD  benzonatate (TESSALON) 200 MG capsule Take 1 capsule (200 mg total) by mouth 3 (three) times daily as needed for cough. 04/04/19   Lauraine Rinne, NP  diazepam (VALIUM) 2 MG tablet Take 1 tablet (2 mg total) by mouth every 6 (six) hours as needed for anxiety (Prior to PET scan; may repeat x1 if needed). 04/04/19   Martyn Ehrich, NP  fluticasone furoate-vilanterol (BREO ELLIPTA) 100-25 MCG/INH AEPB Inhale 1 puff into the lungs daily. 03/27/19   Candee Furbish, MD  fluticasone furoate-vilanterol (BREO ELLIPTA) 100-25 MCG/INH AEPB Inhale 1 puff into the lungs daily. 04/05/19   Lauraine Rinne, NP  HYDROcodone-homatropine (HYCODAN) 5-1.5 MG/5ML syrup Take 5 mLs by mouth every 6 (six) hours as needed for cough. 04/02/19   Lauraine Rinne, NP  ondansetron (ZOFRAN ODT) 4 MG disintegrating tablet Take 1 tablet (4 mg total) by mouth every 8 (eight) hours as needed for nausea or vomiting. 04/11/19   Seleena Reimers E, PA-C  oxyCODONE-acetaminophen (PERCOCET/ROXICET) 5-325 MG tablet Take 2 tablets by mouth every 6 (six) hours as needed for up to 7 days for severe pain. 04/11/19 04/18/19  Lakira Ogando E, PA-C  predniSONE (DELTASONE) 20 MG tablet Take 2 tablets (40 mg total) by mouth daily for 7 days. 04/11/19 04/18/19  Ryleigh Buenger, Harley Hallmark, PA-C    Allergies    Patient has no known allergies.  Review of Systems   Review of Systems All other systems are reviewed and are negative for acute change except as noted in the HPI.  Physical Exam Updated Vital Signs BP 139/90   Pulse 87   Temp 98 F (36.7 C) (Oral)   Resp 18   SpO2 96%   Physical Exam Vitals and nursing note reviewed.  Constitutional:      General: He is not in acute distress.    Appearance: He is not ill-appearing.  HENT:     Head: Normocephalic and atraumatic.     Comments: No sinus or temporal tenderness.     Right Ear: Tympanic membrane and external ear normal.     Left Ear: Tympanic membrane and external ear normal.     Nose: Nose normal.     Mouth/Throat:     Mouth: Mucous membranes are moist.     Pharynx: Oropharynx is clear.  Eyes:     General: No scleral icterus.       Right eye: No discharge.        Left eye: No discharge.     Extraocular Movements: Extraocular movements intact.     Conjunctiva/sclera: Conjunctivae normal.     Pupils:  Pupils are equal, round, and reactive to light.  Neck:     Vascular: No JVD.  Cardiovascular:     Rate and Rhythm: Normal rate and regular rhythm.     Pulses: Normal pulses.          Radial pulses are 2+ on the right side and 2+ on the left side.     Heart sounds: Normal heart sounds.  Pulmonary:     Comments: Expiratory wheeze heard in all lung field.  symmetric chest rise. No rales or rhonchi. SpO2 96% on room air.  Patient has frequent nonproductive cough during interview  and exam Abdominal:     Comments: Abdomen is soft, non-distended, and non-tender in all quadrants. No rigidity, no guarding. No peritoneal signs.  Musculoskeletal:        General: Normal range of motion.     Cervical back: Normal range of motion.  Skin:    General: Skin is warm and dry.     Capillary Refill: Capillary refill takes less than 2 seconds.  Neurological:     Mental Status: He is oriented to person, place, and time.     GCS: GCS eye subscore is 4. GCS verbal subscore is 5. GCS motor subscore is 6.     Comments: Fluent speech, no facial droop.  Speech is clear and goal oriented, follows commands CN III-XII intact, no facial droop Normal strength in upper and lower extremities bilaterally including dorsiflexion and plantar flexion, strong and equal grip strength Sensation normal to light and sharp touch Moves extremities without ataxia, coordination intact Normal finger to nose and rapid alternating movements Normal gait and balance   Psychiatric:        Behavior: Behavior normal.       ED Results / Procedures / Treatments   Labs (all labs ordered are listed, but only abnormal results are displayed) Labs Reviewed  BASIC METABOLIC PANEL - Abnormal; Notable for the following components:      Result Value   Glucose, Bld 101 (*)    All other components within normal limits  CBC - Abnormal; Notable for the following components:   Platelets 441 (*)    All other components within normal limits     EKG None  Radiology MR BRAIN W WO CONTRAST  Result Date: 04/11/2019 CLINICAL DATA:  Metastatic lung cancer. Head trauma with headache. Rule out metastatic disease. EXAM: MRI HEAD WITHOUT AND WITH CONTRAST TECHNIQUE: Multiplanar, multiecho pulse sequences of the brain and surrounding structures were obtained without and with intravenous contrast. CONTRAST:  75mL GADAVIST GADOBUTROL 1 MMOL/ML IV SOLN COMPARISON:  None. FINDINGS: Brain: Ventricle size normal. Negative for infarct, hemorrhage, mass. No fluid collection or midline shift. Normal enhancement.  Negative for metastatic disease. Vascular: Normal arterial flow voids. Skull and upper cervical spine: No focal skeletal lesion. Sinuses/Orbits: Mucosal edema paranasal sinuses.  Normal orbit none Other: Mild motion degraded study. IMPRESSION: Negative MRI brain with contrast.  Negative for metastatic disease. Electronically Signed   By: Franchot Gallo M.D.   On: 04/11/2019 19:10   NM PET Image Initial (PI) Skull Base To Thigh  Result Date: 04/10/2019 CLINICAL DATA:  Initial treatment strategy for recently diagnosed left upper lobe non-small cell lung cancer. EXAM: NUCLEAR MEDICINE PET SKULL BASE TO THIGH TECHNIQUE: 7.7 mCi F-18 FDG was injected intravenously. Full-ring PET imaging was performed from the skull base to thigh after the radiotracer. CT data was obtained and used for attenuation correction and anatomic localization. Fasting blood glucose: 106 mg/dl COMPARISON:  03/20/2019 chest CT. FINDINGS: Mediastinal blood pool activity: SUV max 3.1 Liver activity: SUV max NA NECK: No enlarged or hypermetabolic lymph nodes in the neck. Curvilinear hypermetabolism in a right scalene muscle is probably activity related. Incidental CT findings: Mucous retention cyst versus polyp in the inferior right maxillary sinus. Nonhypermetabolic 2.7 cm right thyroid nodule. CHEST: Infiltrative hypermetabolic 8.2 x 5.1 cm central left upper lobe lung mass (series  8/image 32), contiguous with the left hilum, with max SUV 13.4. Additional hypermetabolic left upper lobe pulmonary nodules measuring 2.6 cm peripherally with max SUV 9.1 (series 8/image 26) and 1.7  cm inferiorly with max SUV 7.8 (series 8/image 39). Right lower lobe 0.4 cm solid pulmonary nodule is below PET resolution (series 8/image 31). Enlarged hypermetabolic 1.3 cm subcarinal node with max SUV 10.1 (series 4/image 75). Left hilar nodal hypermetabolism with max SUV 8.1. Hypermetabolic 0.9 cm AP window node with max SUV 5.7 (series 4/image 70). Left paraspinal focus of hypermetabolism at the T12 level with max SUV 4.9, without discrete CT correlate. Incidental CT findings: Small dependent left pleural effusion. Moderate centrilobular emphysema. ABDOMEN/PELVIS: No abnormal hypermetabolic activity within the liver, pancreas, adrenal glands, or spleen. No hypermetabolic lymph nodes in the abdomen or pelvis. Incidental CT findings: none SKELETON: Hypermetabolic lytic 0.9 cm right iliac bone lesion with max SUV 7.9 (series 4/image 164). No additional skeletal foci of hypermetabolism. Incidental CT findings: none IMPRESSION: 1. Infiltrative hypermetabolic 8.2 cm central left upper lobe lung mass, contiguous with the left hilum, compatible with primary bronchogenic carcinoma. 2. Two additional hypermetabolic left upper lobe pulmonary nodules, compatible with pulmonary metastases to the same lobe. Right lower lobe 0.4 cm pulmonary nodule is below PET resolution and warrants attention on follow-up chest CT in 3 months. 3. Small dependent left pleural effusion. Nonspecific left T12 paraspinal focus of hypermetabolism without discrete CT correlate, cannot exclude paraspinal/extreme medial basilar left pleural metastasis. 4. Hypermetabolic left hilar, subcarinal and AP window nodal metastases. 5. 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 6. PET-CT staging is Stage IVA (T4 N2 M1b). 7.  Emphysema  (ICD10-J43.9). Electronically Signed   By: Ilona Sorrel M.D.   On: 04/10/2019 09:59    Procedures Procedures (including critical care time)  Medications Ordered in ED Medications  predniSONE (DELTASONE) tablet 40 mg (40 mg Oral Given 04/11/19 1747)  oxyCODONE-acetaminophen (PERCOCET/ROXICET) 5-325 MG per tablet 1 tablet (has no administration in time range)  diazepam (VALIUM) tablet 5 mg (5 mg Oral Given 04/11/19 1746)  LORazepam (ATIVAN) injection 2 mg (2 mg Intravenous Given 04/11/19 1831)  LORazepam (ATIVAN) 2 MG/ML injection (  Given 04/11/19 1832)  gadobutrol (GADAVIST) 1 MMOL/ML injection 7 mL (7 mLs Intravenous Contrast Given 04/11/19 1900)    ED Course  I have reviewed the triage vital signs and the nursing notes.  Pertinent labs & imaging results that were available during my care of the patient were reviewed by me and considered in my medical decision making (see chart for details).    MDM Rules/Calculators/A&P                      Patient seen and examined. Patient presents awake, alert, hemodynamically stable, afebrile, non toxic.  During exam he has a hacking nonproductive cough, no hypoxia.  He has wheezing heard in all lung fields No respiratory distress.  Neuro exam is normal.  BMP and CBC overall unremarkable.  Patient has also been seen by pulmonologist Dr. Tamala Julian Has ordered MRI brain with and without contrast. Also ordered Ativan, Valium, PO Prednisone, pain medicine for patient.  MRI is negative for brain mets.  Assessment patient reports headache has improved with medications here in the emergency department.  Dr. Tamala Julian reviewed results and stated patient's wife.  I updated patient on findings and plan of care.  At the recommendation of Dr. Tamala Julian will discharge home with prescription for prednisone x7 days for acute exacerbation of COPD.  Will also send prescription for 7 days of Percocet, patient is aware not to drive or work while taking his medicine as it can make him  drowsy.  He also small amount of  Prescription at home for tussionex and he knows not to take these medication at the same time. I have reviewed the PDMP during this encounter. Please see pulmonology consult note.  The patient appears reasonably screened and/or stabilized for discharge and I doubt any other medical condition or other [facility] requiring further screening, evaluation, or treatment in the ED at this time prior to discharge. The patient is safe for discharge with strict return precautions discussed. Recommend pcp/pulmonology follow up. Appreciate assistance from pulmonology in ED workup. Findings and plan of care discussed with supervising physician Dr. Johnney Killian.    Portions of this note were generated with [employer]. Dictation errors may occur despite best attempts at proofreading.   Final Clinical Impression(s) / ED Diagnoses Final diagnoses:  Acute nonintractable headache, unspecified headache type    Rx / DC Orders ED Discharge Orders         Ordered    predniSONE (DELTASONE) 20 MG tablet  Daily     04/11/19 2028    oxyCODONE-acetaminophen (PERCOCET/ROXICET) 5-325 MG tablet  Every 6 hours PRN     04/11/19 2028    ondansetron (ZOFRAN ODT) 4 MG disintegrating tablet  Every 8 hours PRN     04/11/19 2030           Flint Melter 04/11/19 2035    Charlesetta Shanks, MD 04/15/19 1504

## 2019-04-11 NOTE — Telephone Encounter (Signed)
Noted by triage, thank you.

## 2019-04-11 NOTE — Consult Note (Signed)
Pulmonary Consult Note  S: Known to me from clinic.  Recently discovered very poorly differentiated lung cancer with mets to bone and mediastinum.  Coming in with 3 days worsening headache, PO.  ROS + symptoms in bold Fevers, chills, weight loss Nausea, vomiting, diarrhea Shortness of breath, wheezing, cough Chest pain, palpitations, lower ext edema   O: Today's Vitals   04/11/19 1555 04/11/19 1633  BP: 116/82   Pulse: 88   Resp: 17   Temp: 98 F (36.7 C)   TempSrc: Oral   SpO2: 98%   PainSc:  3    There is no height or weight on file to calculate BMI.   GEN: middle aged man in NAD HEENT:  MMM, no thrush CV:  RRR, ext warm PULM:  Wheezing bilaterally GI: Soft, +BS EXT: No edema NEURO:  GCS2-12 intact and symmetric Symmetric strength Symmetric sensorium No pronator drift PSYCH: AOx3, good insight SKIN: No rashes  CBC benign with exception of mild thrombocytosis BMP pending PET Incidental CT findings: none  IMPRESSION: 1. Infiltrative hypermetabolic 8.2 cm central left upper lobe lung mass, contiguous with the left hilum, compatible with primary bronchogenic carcinoma. 2. Two additional hypermetabolic left upper lobe pulmonary nodules, compatible with pulmonary metastases to the same lobe. Right lower lobe 0.4 cm pulmonary nodule is below PET resolution and warrants attention on follow-up chest CT in 3 months. 3. Small dependent left pleural effusion. Nonspecific left T12 paraspinal focus of hypermetabolism without discrete CT correlate, cannot exclude paraspinal/extreme medial basilar left pleural metastasis. 4. Hypermetabolic left hilar, subcarinal and AP window nodal metastases. 5. 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 6. PET-CT staging is Stage IVA (T4 N2 M1b). 7.  Emphysema (ICD10-J43.9).   A: -New headache in metastatic lung cancer patient, r/o brain mets- physical exam reassuring -Poor PO -Wheezing, question  AECOPD  P: -Stat MRI brain, if mets, dexamethasone and expedited radonc f/u -Start prednisone for AECOPD otherwise 40mg  x 7 days -Would be liberal with opiates, please give him enough to last 7-14 days (percocet 5/325mg , I will refill PRN -Updated wife/patient regarding plan  Appreciate ER help caring for this patient  Erskine Emery MD PCCM

## 2019-04-11 NOTE — Discharge Instructions (Addendum)
You have been seen today for headache. Please read and follow all provided instructions. Return to the emergency room for worsening condition or new concerning symptoms.    1. Medications:  Prescription to your pharmacy for prednisone.  This is a steroid used to help treat COPD exacerbations.  Please take as prescribed. -Prescription also sent for Percocet.  This is a narcotic pain medicine.  You can take this every 6 hours for severe pain.  This is a narcotic and can make you drowsy so you cannot drive or work while taking this medicine.  This medicine also has Tylenol in it so do not take any additional Tylenol. --Do not take percocet at the same time as hydrocodone cough medicine as  they both are narcotics like we discussed. -Prescription sent for Zofran.  This is a medicine used to treat nausea.  Please take as prescribed.  Continue usual home medications Take medications as prescribed. Please review all of the medicines and only take them if you do not have an allergy to them.   2. Treatment: rest, drink plenty of fluids  3. Follow Up:  Please follow up with primary care provider by scheduling an appointment as soon as possible for a visit  If you do not have a primary care physician, contact [facility] at [phone] for referral   It is also a possibility that you have an allergic reaction to any of the medicines that you have been prescribed - Everybody reacts differently to medications and while MOST people have no trouble with most medicines, you may have a reaction such as nausea, vomiting, rash, swelling, shortness of breath. If this is the case, please stop taking the medicine immediately and contact your physician.  ?

## 2019-04-11 NOTE — Telephone Encounter (Signed)
Called and spoke with pt's wife George Mullen who stated over the last 4-5 days pt has had a worsening headache on the left side and also has had a worsening cough. George Mullen stated pt is also extremely lethargic and is not eating which began over the last 3 days with diminished appetite. Pt did drink a boost this morning and had some ensures yesterday 2/3 but pt is not wanting to eat anything.  Pt has taken tylenol or ibuprofen for the headache every 4 hours back and forth and George Mullen also stated they had Rx for tramadol which she has given him some of that and the meds have helped some but the headache is still there. Pt has taken tessalon, cough syrup, and all other meds prescribed by Dr. Tamala Julian for the cough. Pt is also doing neb tx to see if it would help.  George Mullen stated how lethargic pt is is what is concerning her.  George Mullen also stated that pt's work is needing another note for work as pt still has not been able to go to work. The note is needed as they are wanting to begin FMLA paperwork and his job is requiring the letter before they are able to begin the paperwork.  George Mullen stated that Dr. Tamala Julian called them yesterday and left a message that pt's recent imaging was being reviewed. Due to Dr. Tamala Julian not being at the office but since he has been in touch with pt recently, I am routing this to both George Mullen and Dr. Tamala Julian. Please advise on this for pt and wife George Mullen.

## 2019-04-11 NOTE — Progress Notes (Signed)
The proposed treatment discussed in cancer conference is for discussion purpose only and is not a binding recommendation. The patient was not physically examined nor present for their treatment options. Therefore, final treatment plans cannot be decided.  ?

## 2019-04-12 ENCOUNTER — Telehealth: Payer: Self-pay | Admitting: Internal Medicine

## 2019-04-12 NOTE — Telephone Encounter (Signed)
I am more than happy to sign the paperwork.  I would write him out of work for at least the next 2 weeks.  This allows him to start the FMLA process be discussed at the tumor board as well as likely establish with oncology.We can go ahead and sign a letter today and mail it to the patient.  Patient also has upcoming follow-up with Dr. Tamala Julian on 04/22/2019.Wyn Quaker, FNP

## 2019-04-12 NOTE — Telephone Encounter (Signed)
Went back to work 2/2 and worked X3 hours, had to leave d/t lethargy, fatigue, dyspnea.  Pt has not worked since, and does not plan on going back to work next week. Pt was seen by Dr. Tamala Julian last night in ED, states that his case was being referred to tumor board to be scheduled for follow up.  No follow up has been scheduled yet.   Want a note out of work starting 2/8 - whenever George Mullen feels confident writing him out of work.  Since pt has not been scheduled for a rov with oncology yet he does not know how his treatment will be scheduled.   Pt notes that he needs this letter pretty quickly to start his FMLA process and secure his job.  I advised that our office is closed, but that we would call when the letter is typed and the wife can come pick this up from our office.  George Mullen please advise if ok with writing a new letter writing out of work, starting Monday 2/8, and what you would like the letter to include.  Thanks!

## 2019-04-15 ENCOUNTER — Telehealth: Payer: Self-pay | Admitting: *Deleted

## 2019-04-15 ENCOUNTER — Telehealth: Payer: Self-pay | Admitting: Internal Medicine

## 2019-04-15 ENCOUNTER — Encounter: Payer: Self-pay | Admitting: *Deleted

## 2019-04-15 DIAGNOSIS — R918 Other nonspecific abnormal finding of lung field: Secondary | ICD-10-CM

## 2019-04-15 NOTE — Telephone Encounter (Signed)
Oncology Nurse Navigator Documentation  Oncology Nurse Navigator Flowsheets 04/15/2019  Navigator Location [facility]  Referral Date to RadOnc/MedOnc -  Navigator Encounter Type Telephone/I received a message from patient's wife.  I called her back and scheduled her husband to be seen this week at thoracic clinic.  She verbalized understanding of appt time and place.   Telephone Incoming Call;Outgoing Call  Treatment Phase Abnormal Scans  Barriers/Navigation Needs Coordination of Care;Education  Education Other  Interventions Coordination of Care;Education  Acuity Level 2-Minimal Needs (1-2 Barriers Identified)  Coordination of Care Appts  Education Method Verbal  Time Spent with Patient 15

## 2019-04-15 NOTE — Telephone Encounter (Signed)
ATC pt's wife, there was no answer and I could not leave a message. Will try back.

## 2019-04-15 NOTE — Telephone Encounter (Signed)
Letter done and mailed to spouse  I sent her link to sign up for mychart so he can also access the letter that way

## 2019-04-15 NOTE — Telephone Encounter (Signed)
Oncology Nurse Navigator Documentation  Oncology Nurse Navigator Flowsheets 04/15/2019  Navigator Location [facility]  Referral Date to RadOnc/MedOnc 04/12/2019  Navigator Encounter Type Telephone/Dr. Julien Nordmann and I received referral on Mr. Traweek.  I called to schedule an appt with Dr. Julien Nordmann but was unable to reach. I did leave vm message for him to call me with my name and phone number.   Telephone Outgoing Call  Treatment Phase Abnormal Scans  Barriers/Navigation Needs Coordination of Care;Education  Education Other  Interventions Coordination of Care;Education  Acuity Level 2-Minimal Needs (1-2 Barriers Identified)  Coordination of Care Other  Education Method Verbal  Time Spent with Patient 15

## 2019-04-16 ENCOUNTER — Encounter: Payer: Self-pay | Admitting: *Deleted

## 2019-04-16 NOTE — Telephone Encounter (Signed)
Spoke with pt's wife and advised her to log into the mychart and click on the top left tab and go down to letters. She found the letter and nothing further is needed.

## 2019-04-18 ENCOUNTER — Inpatient Hospital Stay: Payer: Managed Care, Other (non HMO)

## 2019-04-18 ENCOUNTER — Encounter: Payer: Self-pay | Admitting: *Deleted

## 2019-04-18 ENCOUNTER — Encounter: Payer: Self-pay | Admitting: Internal Medicine

## 2019-04-18 ENCOUNTER — Other Ambulatory Visit: Payer: Self-pay | Admitting: *Deleted

## 2019-04-18 ENCOUNTER — Inpatient Hospital Stay: Payer: Managed Care, Other (non HMO) | Attending: Internal Medicine | Admitting: Internal Medicine

## 2019-04-18 ENCOUNTER — Other Ambulatory Visit: Payer: Self-pay

## 2019-04-18 VITALS — BP 108/67 | HR 85 | Temp 98.0°F | Resp 18 | Ht 68.0 in | Wt 156.3 lb

## 2019-04-18 DIAGNOSIS — J439 Emphysema, unspecified: Secondary | ICD-10-CM | POA: Insufficient documentation

## 2019-04-18 DIAGNOSIS — Z5111 Encounter for antineoplastic chemotherapy: Secondary | ICD-10-CM | POA: Insufficient documentation

## 2019-04-18 DIAGNOSIS — C3492 Malignant neoplasm of unspecified part of left bronchus or lung: Secondary | ICD-10-CM

## 2019-04-18 DIAGNOSIS — Z7189 Other specified counseling: Secondary | ICD-10-CM | POA: Insufficient documentation

## 2019-04-18 DIAGNOSIS — Z79899 Other long term (current) drug therapy: Secondary | ICD-10-CM | POA: Insufficient documentation

## 2019-04-18 DIAGNOSIS — Z5112 Encounter for antineoplastic immunotherapy: Secondary | ICD-10-CM | POA: Insufficient documentation

## 2019-04-18 DIAGNOSIS — R918 Other nonspecific abnormal finding of lung field: Secondary | ICD-10-CM

## 2019-04-18 DIAGNOSIS — J90 Pleural effusion, not elsewhere classified: Secondary | ICD-10-CM | POA: Insufficient documentation

## 2019-04-18 DIAGNOSIS — E041 Nontoxic single thyroid nodule: Secondary | ICD-10-CM | POA: Insufficient documentation

## 2019-04-18 DIAGNOSIS — C7951 Secondary malignant neoplasm of bone: Secondary | ICD-10-CM | POA: Insufficient documentation

## 2019-04-18 DIAGNOSIS — Z801 Family history of malignant neoplasm of trachea, bronchus and lung: Secondary | ICD-10-CM | POA: Insufficient documentation

## 2019-04-18 DIAGNOSIS — R0781 Pleurodynia: Secondary | ICD-10-CM | POA: Diagnosis not present

## 2019-04-18 DIAGNOSIS — Z87891 Personal history of nicotine dependence: Secondary | ICD-10-CM | POA: Insufficient documentation

## 2019-04-18 DIAGNOSIS — C7800 Secondary malignant neoplasm of unspecified lung: Secondary | ICD-10-CM | POA: Insufficient documentation

## 2019-04-18 DIAGNOSIS — F17210 Nicotine dependence, cigarettes, uncomplicated: Secondary | ICD-10-CM | POA: Insufficient documentation

## 2019-04-18 DIAGNOSIS — R51 Headache with orthostatic component, not elsewhere classified: Secondary | ICD-10-CM | POA: Insufficient documentation

## 2019-04-18 DIAGNOSIS — C3412 Malignant neoplasm of upper lobe, left bronchus or lung: Secondary | ICD-10-CM | POA: Diagnosis not present

## 2019-04-18 DIAGNOSIS — H5712 Ocular pain, left eye: Secondary | ICD-10-CM | POA: Insufficient documentation

## 2019-04-18 LAB — CMP (CANCER CENTER ONLY)
ALT: 37 U/L (ref 0–44)
AST: 13 U/L — ABNORMAL LOW (ref 15–41)
Albumin: 3.2 g/dL — ABNORMAL LOW (ref 3.5–5.0)
Alkaline Phosphatase: 85 U/L (ref 38–126)
Anion gap: 8 (ref 5–15)
BUN: 22 mg/dL — ABNORMAL HIGH (ref 6–20)
CO2: 29 mmol/L (ref 22–32)
Calcium: 8.3 mg/dL — ABNORMAL LOW (ref 8.9–10.3)
Chloride: 105 mmol/L (ref 98–111)
Creatinine: 1.2 mg/dL (ref 0.61–1.24)
GFR, Est AFR Am: >60 mL/min (ref >60)
GFR, Estimated: >60 mL/min (ref >60)
Glucose, Bld: 119 mg/dL — ABNORMAL HIGH (ref 70–99)
Potassium: 4 mmol/L (ref 3.5–5.1)
Sodium: 142 mmol/L (ref 135–145)
Total Bilirubin: 0.3 mg/dL (ref 0.3–1.2)
Total Protein: 6.3 g/dL — ABNORMAL LOW (ref 6.5–8.1)

## 2019-04-18 LAB — CBC WITH DIFFERENTIAL (CANCER CENTER ONLY)
Abs Immature Granulocytes: 0.04 10*3/uL (ref 0.00–0.07)
Basophils Absolute: 0.1 10*3/uL (ref 0.0–0.1)
Basophils Relative: 1 %
Eosinophils Absolute: 0.1 10*3/uL (ref 0.0–0.5)
Eosinophils Relative: 1 %
HCT: 46.1 % (ref 39.0–52.0)
Hemoglobin: 15 g/dL (ref 13.0–17.0)
Immature Granulocytes: 0 %
Lymphocytes Relative: 12 %
Lymphs Abs: 1.4 10*3/uL (ref 0.7–4.0)
MCH: 29.9 pg (ref 26.0–34.0)
MCHC: 32.5 g/dL (ref 30.0–36.0)
MCV: 92 fL (ref 80.0–100.0)
Monocytes Absolute: 1 10*3/uL (ref 0.1–1.0)
Monocytes Relative: 8 %
Neutro Abs: 8.9 10*3/uL — ABNORMAL HIGH (ref 1.7–7.7)
Neutrophils Relative %: 78 %
Platelet Count: 385 10*3/uL (ref 150–400)
RBC: 5.01 MIL/uL (ref 4.22–5.81)
RDW: 12.8 % (ref 11.5–15.5)
WBC Count: 11.5 10*3/uL — ABNORMAL HIGH (ref 4.0–10.5)
nRBC: 0 % (ref 0.0–0.2)

## 2019-04-18 MED ORDER — CYANOCOBALAMIN 1000 MCG/ML IJ SOLN
INTRAMUSCULAR | Status: AC
  Filled 2019-04-18: qty 1

## 2019-04-18 MED ORDER — PROCHLORPERAZINE MALEATE 10 MG PO TABS: 10.0000 mg | ORAL_TABLET | Freq: Four times a day (QID) | ORAL | 0 refills | Status: DC | PRN

## 2019-04-18 MED ORDER — FOLIC ACID 1 MG PO TABS: 1.0000 mg | ORAL_TABLET | Freq: Every day | ORAL | 4 refills | Status: AC

## 2019-04-18 MED ORDER — CYANOCOBALAMIN 1000 MCG/ML IJ SOLN
1000.0000 ug | Freq: Once | INTRAMUSCULAR | Status: AC
  Administered 2019-04-18: 1000 ug via INTRAMUSCULAR

## 2019-04-18 NOTE — Progress Notes (Signed)
No show

## 2019-04-18 NOTE — Progress Notes (Signed)
Oncology Nurse Navigator Documentation  Oncology Nurse Navigator Flowsheets 04/18/2019  Abnormal Finding Date 03/07/2019  Confirmed Diagnosis Date 04/02/2019  Diagnosis Status Confirmed Diagnosis Complete  Navigator Location [facility]  Referral Date to RadOnc/MedOnc -  Navigator Encounter Type Clinic/MDC/I spoke with patient and family today at thoracic clinic.  I help to explain next steps and plan of care.  [employer] completed.    Telephone -  [facility] Date 04/18/2019  Multidisiplinary Clinic Type Thoracic  Patient Visit Type Initial;MedOnc  Treatment Phase Pre-Tx/Tx Discussion  Barriers/Navigation Needs Coordination of Care;Education  Education Other  Interventions Education  Acuity Level 3-Moderate Needs (3-4 Barriers Identified)  Coordination of Care Appts;Other  Education Method Verbal  Time Spent with Patient 30

## 2019-04-18 NOTE — Progress Notes (Signed)
The proposed treatment discussed in cancer conference 04/18/19 is for discussion purpose only and is not a binding recommendation.  The patient was not physically examined nor present for their treatment options.  Therefore, final treatment plans cannot be decided.  

## 2019-04-18 NOTE — Progress Notes (Signed)
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   [id number]:Pembrolizumab 200 mg  IV D1 + Pemetrexed 500 mg/m2 IV D1 + Carboplatin AUC=5 IV D1 q21 Days:   A cycle is every 21 days:     Pembrolizumab      Pemetrexed      Carboplatin   **Always confirm dose/schedule in your pharmacy ordering system**  Patient Characteristics: Stage IV Metastatic, Nonsquamous, Initial Chemotherapy/Immunotherapy, PS = 0, 1, ALK or EGFR or ROS1 or NTRK or MET or RET Genomic Alterations - Awaiting Test Results AJCC T Category: T4 Current Disease Status: Distant Metastases AJCC N Category: N2 AJCC M Category: M1c AJCC 8 Stage Grouping: IVB Histology: Nonsquamous Cell ROS1 Rearrangement Status: Awaiting Test Results [id number] Mutation Status: Not Applicable - EGFR Mutation Negative/Unknown Other Mutations/Biomarkers: No Other Actionable Mutations Chemotherapy/Immunotherapy LOT: Initial Chemotherapy/Immunotherapy Molecular Targeted Therapy: Not Appropriate MET Exon 14 Mutation Status: Awaiting Test Results RET Gene Fusion Status: Awaiting Test Results EGFR Mutation Status: Awaiting Test Results NTRK Gene Fusion Status: Awaiting Test Results PD-L1 Expression Status: Quantity Not Sufficient ALK Rearrangement Status: Awaiting Test Results BRAF V600E Mutation Status: Awaiting Test Results ECOG Performance Status: 1 Biomarker Assessment Status Confirmation: Awaiting genomic biomarker test(s) results and need to start chemotherapy Intent of Therapy: Non-Curative / Palliative Intent, Discussed with Patient

## 2019-04-18 NOTE — Progress Notes (Signed)
[place] Telephone:([phone]) [phone]   [phone]  CONSULT NOTE  REFERRING PHYSICIAN: Dr. Ina Homes  REASON FOR CONSULTATION:  59 years old white male recently diagnosed with lung cancer.  HPI George Mullen is a 59 y.o. male with no significant past medical history except for appendectomy, hernia repair, hip surgery as well as shoulder surgery but the patient has a long history of smoking.  The patient mentioned that he has been complaining of cough since Christmas time.  He was seen by his primary care physician and treated with a course of steroids as well as antibiotic for suspicious pneumonia.  Repeat chest x-ray showed opacity in the left lung and he was treated with another course of steroids and antibiotics with no improvement.  He had CT scan of the chest performed at [facility] on March 23, 2019 and that showed left upper lobe lung mass with hilar and mediastinal involvement.  The patient was referred to Dr. Tamala Julian.  On April 02, 2019 he underwent flexible video fiberoptic bronchoscopy with endobronchial ultrasound and biopsies.  The final cytology ([id number]) of the fine-needle aspiration of 4R as well as 7 and left upper lobe lung mass showed malignant cells.  Malignant cells morphologically similar to specimen B are observed. Immunohistochemistry for CK7 (strong) and CDX-2 (moderate) is positive.  CK20, TTF-1, PAX 8, GATA-3, p40 and CK5/6 are negative. The immunophenotype is nonspecific, and can include origin from lung and upper gastrointestinal/pancreaticobiliary tract, among others.  The patient had a PET scan performed on April 10, 2019 and showed infiltrative hypermetabolic 8.2 cm central left upper lobe lung mass contiguous with the left hilum compatible with primary bronchogenic carcinoma.  There was 2 additional hypermetabolic left upper lobe pulmonary nodules compatible with pulmonary metastasis to the same lobe in addition to right lower  lobe 0.4 cm pulmonary nodule below the PET resolution.  There was also a small dependent left pleural effusion and nonspecific left T12 paraspinal focus of hypermetabolism without discrete CT correlate and metastasis could not be completely excluded.  The patient also has hypermetabolic left hilar, subcarinal and AP window nodal metastasis in addition to solitary hypermetabolic bone metastasis to the right iliac bone.  He also had MRI of the brain on April 11, 2019 and that showed no evidence of metastatic disease to the brain. The patient was referred to the multidisciplinary thoracic oncology clinic today for evaluation and recommendation regarding treatment of his condition. When seen today he continues to have cough productive of clear sputum with no hemoptysis.  He continues to have shortness of breath at baseline increased with exertion especially with the coughing spells and pain in the chest and rib cage from the cough.  He also has headache and intermittent left eye pain.  The patient lost around 6 pounds in the last 2 weeks.  He has no nausea, vomiting, diarrhea or constipation. Family history significant for mother still alive and has COPD, father still alive and healthy. Maternal grandmother had lung cancer and paternal grand mother had stomach cancer. The patient is married and has 1 daughter and one stepdaughter.  He used to work in Primary school teacher.  The patient has a history for smoking 1.5 pack/day for around 44 years and unfortunately he continues to smoke around 4 cigarettes every day.  He drinks alcohol occasionally and no history of drug abuse.  He lives in [place].    HPI  Past Medical History:  Diagnosis Date  .  Cancer ([place])    lung cancer  . Dyspnea    with exertion  . PONV (postoperative nausea and vomiting)     Past Surgical History:  Procedure Laterality Date  . APPENDECTOMY    . BRONCHIAL NEEDLE ASPIRATION BIOPSY  04/02/2019   Procedure:  BRONCHIAL NEEDLE ASPIRATION BIOPSIES;  Surgeon: Candee Furbish, MD;  Location: [facility] ENDOSCOPY;  Service: Endoscopy;;  . ENDOBRONCHIAL ULTRASOUND  04/02/2019   Procedure: ENDOBRONCHIAL ULTRASOUND;  Surgeon: Candee Furbish, MD;  Location: [place];  Service: Endoscopy;;  . HIP SURGERY Right   . INGUINAL HERNIA REPAIR    . SHOULDER SURGERY Right   . VIDEO BRONCHOSCOPY N/A 04/02/2019   Procedure: VIDEO BRONCHOSCOPY WITHOUT FLUORO;  Surgeon: Candee Furbish, MD;  Location: [facility] ENDOSCOPY;  Service: Endoscopy;  Laterality: N/A;  . WISDOM TOOTH EXTRACTION      Family History  Problem Relation Age of Onset  . COPD Mother   . Lung cancer Maternal Grandmother   . Stomach cancer Paternal Grandmother     Social History Social History   Tobacco Use  . Smoking status: Former Smoker    Packs/day: 1.50    Years: 45.00    Pack years: 67.50    Types: Cigarettes    Quit date: 03/18/2019    Years since quitting: 0.0  . Smokeless tobacco: Never Used  Substance Use Topics  . Alcohol use: Yes    Comment: Occasional Beer  . Drug use: Never    No Known Allergies  Current Outpatient Medications  Medication Sig Dispense Refill  . acetaminophen (TYLENOL) 500 MG tablet Take 500 mg by mouth every 6 (six) hours as needed.    Marland Kitchen albuterol (PROVENTIL) (2.5 MG/3ML) 0.083% nebulizer solution Take 3 mLs (2.5 mg total) by nebulization every 6 (six) hours as needed for wheezing or shortness of breath. 75 mL 12  . benzonatate (TESSALON) 200 MG capsule Take 1 capsule (200 mg total) by mouth 3 (three) times daily as needed for cough. 30 capsule 1  . diazepam (VALIUM) 2 MG tablet Take 1 tablet (2 mg total) by mouth every 6 (six) hours as needed for anxiety (Prior to PET scan; may repeat x1 if needed). 2 tablet 0  . fluticasone furoate-vilanterol (BREO ELLIPTA) 100-25 MCG/INH AEPB Inhale 1 puff into the lungs daily. 28 each 0  . fluticasone furoate-vilanterol (BREO ELLIPTA) 100-25 MCG/INH AEPB Inhale 1 puff into the  lungs daily. 60 each 0  . HYDROcodone-homatropine (HYCODAN) 5-1.5 MG/5ML syrup Take 5 mLs by mouth every 6 (six) hours as needed for cough. 240 mL 0  . ondansetron (ZOFRAN ODT) 4 MG disintegrating tablet Take 1 tablet (4 mg total) by mouth every 8 (eight) hours as needed for nausea or vomiting. 20 tablet 0  . oxyCODONE-acetaminophen (PERCOCET/ROXICET) 5-325 MG tablet Take 2 tablets by mouth every 6 (six) hours as needed for up to 7 days for severe pain. 56 tablet 0  . predniSONE (DELTASONE) 20 MG tablet Take 2 tablets (40 mg total) by mouth daily for 7 days. 14 tablet 0   No current facility-administered medications for this visit.    Review of Systems  Constitutional: positive for fatigue and weight loss Eyes: negative Ears, nose, mouth, throat, and face: negative Respiratory: positive for cough, dyspnea on exertion and pleurisy/chest pain Cardiovascular: negative Gastrointestinal: negative Genitourinary:negative Integument/breast: negative Hematologic/lymphatic: negative Musculoskeletal:negative Neurological: negative Behavioral/Psych: negative Endocrine: negative Allergic/Immunologic: negative  Physical Exam  LPF:XTKWI, healthy, no distress, well nourished, well developed and anxious SKIN: skin  color, texture, turgor are normal, no rashes or significant lesions HEAD: Normocephalic, No masses, lesions, tenderness or abnormalities EYES: normal, PERRLA, Conjunctiva are pink and non-injected EARS: External ears normal, Canals clear OROPHARYNX:no exudate, no erythema and lips, buccal mucosa, and tongue normal  NECK: supple, no adenopathy, no JVD LYMPH:  no palpable lymphadenopathy, no hepatosplenomegaly LUNGS: coarse sounds heard, decreased breath sounds HEART: regular rate & rhythm, no murmurs and no gallops ABDOMEN:abdomen soft, non-tender, normal bowel sounds and no masses or organomegaly BACK: No CVA tenderness, Range of motion is normal EXTREMITIES:no joint deformities,  effusion, or inflammation, no edema  NEURO: alert & oriented x 3 with fluent speech, no focal motor/sensory deficits  PERFORMANCE STATUS: ECOG 1  LABORATORY DATA: Lab Results  Component Value Date   WBC 11.5 (H) 04/18/2019   HGB 15.0 04/18/2019   HCT 46.1 04/18/2019   MCV 92.0 04/18/2019   PLT 385 04/18/2019      Chemistry      Component Value Date/Time   NA 136 04/11/2019 1646   K 4.4 04/11/2019 1646   CL 99 04/11/2019 1646   CO2 26 04/11/2019 1646   BUN 15 04/11/2019 1646   CREATININE 1.20 04/11/2019 1646      Component Value Date/Time   CALCIUM 9.1 04/11/2019 1646   ALKPHOS 59 04/02/2019 0651   AST 27 04/02/2019 0651   ALT 36 04/02/2019 0651   BILITOT 0.4 04/02/2019 0651       RADIOGRAPHIC STUDIES: DG Chest 2 View  Result Date: 04/05/2019 CLINICAL DATA:  History of a lung mass. EXAM: CHEST - 2 VIEW COMPARISON:  PA and lateral chest 03/07/2019.  CT chest 03/20/2019. FINDINGS: Left upper lobe mass lesion appears somewhat larger than on the prior plain film. There is a new small left pleural effusion. Lungs are emphysematous. Heart size is normal. No acute bony abnormality. 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 New small left pleural effusion. Emphysema. Electronically Signed   By: Inge Rise M.D.   On: 04/05/2019 11:52   MR BRAIN W WO CONTRAST  Result Date: 04/11/2019 CLINICAL DATA:  Metastatic lung cancer. Head trauma with headache. Rule out metastatic disease. EXAM: MRI HEAD WITHOUT AND WITH CONTRAST TECHNIQUE: Multiplanar, multiecho pulse sequences of the brain and surrounding structures were obtained without and with intravenous contrast. CONTRAST:  1mL GADAVIST GADOBUTROL 1 MMOL/ML IV SOLN COMPARISON:  None. FINDINGS: Brain: Ventricle size normal. Negative for infarct, hemorrhage, mass. No fluid collection or midline shift. Normal enhancement.  Negative for metastatic disease.  Vascular: Normal arterial flow voids. Skull and upper cervical spine: No focal skeletal lesion. Sinuses/Orbits: Mucosal edema paranasal sinuses.  Normal orbit none Other: Mild motion degraded study. IMPRESSION: Negative MRI brain with contrast.  Negative for metastatic disease. Electronically Signed   By: Franchot Gallo M.D.   On: 04/11/2019 19:10   NM PET Image Initial (PI) Skull Base To Thigh  Result Date: 04/10/2019 CLINICAL DATA:  Initial treatment strategy for recently diagnosed left upper lobe non-small cell lung cancer. EXAM: NUCLEAR MEDICINE PET SKULL BASE TO THIGH TECHNIQUE: 7.7 mCi F-18 FDG was injected intravenously. Full-ring PET imaging was performed from the skull base to thigh after the radiotracer. CT data was obtained and used for attenuation correction and anatomic localization. Fasting blood glucose: 106 mg/dl COMPARISON:  03/20/2019 chest CT. FINDINGS: Mediastinal blood pool activity: SUV max 3.1 Liver activity: SUV max NA NECK: No enlarged or hypermetabolic lymph  nodes in the neck. Curvilinear hypermetabolism in a right scalene muscle is probably activity related. Incidental CT findings: Mucous retention cyst versus polyp in the inferior right maxillary sinus. Nonhypermetabolic 2.7 cm right thyroid nodule. CHEST: Infiltrative hypermetabolic 8.2 x 5.1 cm central left upper lobe lung mass (series 8/image 32), contiguous with the left hilum, with max SUV 13.4. Additional hypermetabolic left upper lobe pulmonary nodules measuring 2.6 cm peripherally with max SUV 9.1 (series 8/image 26) and 1.7 cm inferiorly with max SUV 7.8 (series 8/image 39). Right lower lobe 0.4 cm solid pulmonary nodule is below PET resolution (series 8/image 31). Enlarged hypermetabolic 1.3 cm subcarinal node with max SUV 10.1 (series 4/image 75). Left hilar nodal hypermetabolism with max SUV 8.1. Hypermetabolic 0.9 cm AP window node with max SUV 5.7 (series 4/image 70). Left paraspinal focus of hypermetabolism at the T12  level with max SUV 4.9, without discrete CT correlate. Incidental CT findings: Small dependent left pleural effusion. Moderate centrilobular emphysema. ABDOMEN/PELVIS: No abnormal hypermetabolic activity within the liver, pancreas, adrenal glands, or spleen. No hypermetabolic lymph nodes in the abdomen or pelvis. Incidental CT findings: none SKELETON: Hypermetabolic lytic 0.9 cm right iliac bone lesion with max SUV 7.9 (series 4/image 164). No additional skeletal foci of hypermetabolism. Incidental CT findings: none IMPRESSION: 1. Infiltrative hypermetabolic 8.2 cm central left upper lobe lung mass, contiguous with the left hilum, compatible with primary bronchogenic carcinoma. 2. Two additional hypermetabolic left upper lobe pulmonary nodules, compatible with pulmonary metastases to the same lobe. Right lower lobe 0.4 cm pulmonary nodule is below PET resolution and warrants attention on follow-up chest CT in 3 months. 3. Small dependent left pleural effusion. Nonspecific left T12 paraspinal focus of hypermetabolism without discrete CT correlate, cannot exclude paraspinal/extreme medial basilar left pleural metastasis. 4. Hypermetabolic left hilar, subcarinal and AP window nodal metastases. 5. 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 6. PET-CT staging is Stage IVA (T4 N2 M1b). 7.  Emphysema (ICD10-J43.9). Electronically Signed   By: Ilona Sorrel M.D.   On: 04/10/2019 09:59    ASSESSMENT: This is a very pleasant 59 years old white male recently diagnosed with stage IVB (T4, N2, M1c) non-small cell lung cancer likely lung primary based on the imaging studies diagnosed in February 2021 with unclear subtype from pathology.  The patient presented with large left upper lobe lung mass in addition to left hilar and mediastinal lymphadenopathy and metastatic nodules and the left upper lobe as well as right upper lobe in addition to bone metastasis in the right iliac bone.   PLAN: I had a lengthy  discussion with the patient and his wife today about his current disease stage, prognosis and treatment options. I personally and independently reviewed the scan images and discussed the result and showed the images to the patient and his wife. I explained to the patient that he has incurable condition and all the treatment will be of palliative nature. I discussed with the patient the option of palliative care versus palliative systemic chemotherapy with carboplatin for AUC of 5, Alimta 500 mg/M2 and Keytruda 200 mg IV every 3 weeks.  We will also consider the patient for molecular studies by guardant 360 before starting the first dose of his treatment to rule out any actionable mutations. The patient is interested in this plan.  I discussed with him the adverse effect of this treatment including but not limited to alopecia, myelosuppression, nausea and vomiting, peripheral neuropathy, liver or renal dysfunction in addition to the immunotherapy  adverse effects. He is expected to have the first cycle of this treatment in around 10 days to have the time for the turnaround time of the molecular studies before proceeding with the treatment. I will arrange for the patient to receive vitamin B12 injection today. I will also arrange for the patient to have a chemotherapy education class before the first dose of his treatment. I will call his pharmacy with prescription for Compazine 10 mg p.o. every 6 hours as needed for nausea in addition to folic acid 1 mg p.o. daily. For the smoking cessation I strongly encouraged the patient to quit smoking. If the patient continues to have uncontrolled symptoms from his left upper lobe lung mass, I would consider referring him to radiation oncology for a short course of palliative radiotherapy to this area. The patient will come back for follow-up visit with the first day of his treatment. He was advised to call immediately if he has any concerning symptoms in the  interval.   The patient voices understanding of current disease status and treatment options and is in agreement with the current care plan.  All questions were answered. The patient knows to call the clinic with any problems, questions or concerns. We can certainly see the patient much sooner if necessary.  Thank you so much for allowing me to participate in the care of [facility]. I will continue to follow up the patient with you and assist in his care.  The total time spent in the appointment was 90 minutes.  Disclaimer: This note was dictated with voice recognition software. Similar sounding words can inadvertently be transcribed and may not be corrected upon review.   Eilleen Kempf April 18, 2019, 3:18 PM

## 2019-04-19 ENCOUNTER — Other Ambulatory Visit: Payer: Managed Care, Other (non HMO)

## 2019-04-19 ENCOUNTER — Telehealth: Payer: Self-pay | Admitting: Internal Medicine

## 2019-04-19 LAB — TSH: TSH: 1.361 u[IU]/mL (ref 0.320–4.118)

## 2019-04-19 NOTE — Telephone Encounter (Signed)
Received call from wife that Derrill started having some sharp right sided chest pain after a coughing spell. Starts around spine and reaches around to front, worse with coughing fits.  Breathing comfortable at rest.  Informed her to keep using percocet PRN and take him to ER if gets to point where (A) he can't get comfortable at all or (B) it starts to affect his breathing.  He will keep his appt Monday with me.  Erskine Emery MD PCCM

## 2019-04-21 ENCOUNTER — Other Ambulatory Visit: Payer: Self-pay

## 2019-04-21 ENCOUNTER — Inpatient Hospital Stay (HOSPITAL_COMMUNITY): Payer: Managed Care, Other (non HMO)

## 2019-04-21 ENCOUNTER — Inpatient Hospital Stay (HOSPITAL_COMMUNITY)
Admission: EM | Admit: 2019-04-21 | Discharge: 2019-04-23 | DRG: 180 | Disposition: A | Discharge status: Home / Self Care | Payer: Managed Care, Other (non HMO) | Attending: Internal Medicine | Admitting: Internal Medicine

## 2019-04-21 ENCOUNTER — Encounter (HOSPITAL_COMMUNITY): Payer: Self-pay | Admitting: *Deleted

## 2019-04-21 ENCOUNTER — Emergency Department (HOSPITAL_COMMUNITY): Payer: Managed Care, Other (non HMO)

## 2019-04-21 DIAGNOSIS — Z87891 Personal history of nicotine dependence: Secondary | ICD-10-CM

## 2019-04-21 DIAGNOSIS — Z800 Family history of malignant neoplasm of digestive organs: Secondary | ICD-10-CM | POA: Diagnosis not present

## 2019-04-21 DIAGNOSIS — R000 Tachycardia, unspecified: Secondary | ICD-10-CM | POA: Diagnosis present

## 2019-04-21 DIAGNOSIS — Z9889 Other specified postprocedural states: Secondary | ICD-10-CM

## 2019-04-21 DIAGNOSIS — C3412 Malignant neoplasm of upper lobe, left bronchus or lung: Principal | ICD-10-CM | POA: Diagnosis present

## 2019-04-21 DIAGNOSIS — R079 Chest pain, unspecified: Secondary | ICD-10-CM | POA: Diagnosis not present

## 2019-04-21 DIAGNOSIS — C3492 Malignant neoplasm of unspecified part of left bronchus or lung: Secondary | ICD-10-CM | POA: Diagnosis not present

## 2019-04-21 DIAGNOSIS — R0902 Hypoxemia: Secondary | ICD-10-CM | POA: Diagnosis present

## 2019-04-21 DIAGNOSIS — Z825 Family history of asthma and other chronic lower respiratory diseases: Secondary | ICD-10-CM

## 2019-04-21 DIAGNOSIS — Z9689 Presence of other specified functional implants: Secondary | ICD-10-CM

## 2019-04-21 DIAGNOSIS — Z79891 Long term (current) use of opiate analgesic: Secondary | ICD-10-CM | POA: Diagnosis not present

## 2019-04-21 DIAGNOSIS — F419 Anxiety disorder, unspecified: Secondary | ICD-10-CM | POA: Diagnosis present

## 2019-04-21 DIAGNOSIS — D72829 Elevated white blood cell count, unspecified: Secondary | ICD-10-CM | POA: Diagnosis present

## 2019-04-21 DIAGNOSIS — R0781 Pleurodynia: Secondary | ICD-10-CM

## 2019-04-21 DIAGNOSIS — Z20822 Contact with and (suspected) exposure to covid-19: Secondary | ICD-10-CM | POA: Diagnosis present

## 2019-04-21 DIAGNOSIS — Z801 Family history of malignant neoplasm of trachea, bronchus and lung: Secondary | ICD-10-CM

## 2019-04-21 DIAGNOSIS — J9809 Other diseases of bronchus, not elsewhere classified: Secondary | ICD-10-CM | POA: Diagnosis present

## 2019-04-21 DIAGNOSIS — J9601 Acute respiratory failure with hypoxia: Secondary | ICD-10-CM | POA: Diagnosis present

## 2019-04-21 DIAGNOSIS — J910 Malignant pleural effusion: Secondary | ICD-10-CM | POA: Diagnosis present

## 2019-04-21 DIAGNOSIS — Z79899 Other long term (current) drug therapy: Secondary | ICD-10-CM | POA: Diagnosis not present

## 2019-04-21 DIAGNOSIS — C7951 Secondary malignant neoplasm of bone: Secondary | ICD-10-CM | POA: Diagnosis present

## 2019-04-21 DIAGNOSIS — J90 Pleural effusion, not elsewhere classified: Secondary | ICD-10-CM | POA: Diagnosis not present

## 2019-04-21 LAB — BODY FLUID CELL COUNT WITH DIFFERENTIAL
Eos, Fluid: 0 %
Lymphs, Fluid: 13 %
Monocyte-Macrophage-Serous Fluid: 81 % (ref 50–90)
Neutrophil Count, Fluid: 6 % (ref 0–25)
Total Nucleated Cell Count, Fluid: 1219 cu mm — ABNORMAL HIGH (ref 0–1000)

## 2019-04-21 LAB — CBC WITH DIFFERENTIAL/PLATELET
Abs Immature Granulocytes: 0.06 10*3/uL (ref 0.00–0.07)
Basophils Absolute: 0.1 10*3/uL (ref 0.0–0.1)
Basophils Relative: 1 %
Eosinophils Absolute: 0.1 10*3/uL (ref 0.0–0.5)
Eosinophils Relative: 1 %
HCT: 46.1 % (ref 39.0–52.0)
Hemoglobin: 15 g/dL (ref 13.0–17.0)
Immature Granulocytes: 1 %
Lymphocytes Relative: 10 %
Lymphs Abs: 1.2 10*3/uL (ref 0.7–4.0)
MCH: 29.8 pg (ref 26.0–34.0)
MCHC: 32.5 g/dL (ref 30.0–36.0)
MCV: 91.7 fL (ref 80.0–100.0)
Monocytes Absolute: 1.2 10*3/uL — ABNORMAL HIGH (ref 0.1–1.0)
Monocytes Relative: 10 %
Neutro Abs: 9.4 10*3/uL — ABNORMAL HIGH (ref 1.7–7.7)
Neutrophils Relative %: 77 %
Platelets: 340 10*3/uL (ref 150–400)
RBC: 5.03 MIL/uL (ref 4.22–5.81)
RDW: 12.9 % (ref 11.5–15.5)
WBC: 12.1 10*3/uL — ABNORMAL HIGH (ref 4.0–10.5)
nRBC: 0 % (ref 0.0–0.2)

## 2019-04-21 LAB — COMPREHENSIVE METABOLIC PANEL
ALT: 48 U/L — ABNORMAL HIGH (ref 0–44)
AST: 22 U/L (ref 15–41)
Albumin: 3.2 g/dL — ABNORMAL LOW (ref 3.5–5.0)
Alkaline Phosphatase: 82 U/L (ref 38–126)
Anion gap: 9 (ref 5–15)
BUN: 18 mg/dL (ref 6–20)
CO2: 28 mmol/L (ref 22–32)
Calcium: 8.5 mg/dL — ABNORMAL LOW (ref 8.9–10.3)
Chloride: 98 mmol/L (ref 98–111)
Creatinine, Ser: 1 mg/dL (ref 0.61–1.24)
GFR calc Af Amer: >60 mL/min (ref >60)
GFR calc non Af Amer: >60 mL/min (ref >60)
Glucose, Bld: 111 mg/dL — ABNORMAL HIGH (ref 70–99)
Potassium: 3.6 mmol/L (ref 3.5–5.1)
Sodium: 135 mmol/L (ref 135–145)
Total Bilirubin: 0.6 mg/dL (ref 0.3–1.2)
Total Protein: 6.3 g/dL — ABNORMAL LOW (ref 6.5–8.1)

## 2019-04-21 LAB — PROTEIN, TOTAL: Total Protein: 6.3 g/dL — ABNORMAL LOW (ref 6.5–8.1)

## 2019-04-21 LAB — RESPIRATORY PANEL BY RT PCR (FLU A&B, COVID)
Influenza A by PCR: NEGATIVE
Influenza B by PCR: NEGATIVE
SARS Coronavirus 2 by RT PCR: NEGATIVE

## 2019-04-21 LAB — PROTEIN, PLEURAL OR PERITONEAL FLUID: Total protein, fluid: 4 g/dL

## 2019-04-21 LAB — LACTATE DEHYDROGENASE, PLEURAL OR PERITONEAL FLUID: LD, Fluid: 2244 U/L — ABNORMAL HIGH (ref 3–23)

## 2019-04-21 LAB — LACTATE DEHYDROGENASE: LDH: 262 U/L — ABNORMAL HIGH (ref 98–192)

## 2019-04-21 LAB — GLUCOSE, PLEURAL OR PERITONEAL FLUID: Glucose, Fluid: 75 mg/dL

## 2019-04-21 MED ORDER — SODIUM CHLORIDE 0.9% FLUSH
3.0000 mL | Freq: Two times a day (BID) | INTRAVENOUS | Status: DC
  Administered 2019-04-21 – 2019-04-23 (×4): 3 mL via INTRAVENOUS

## 2019-04-21 MED ORDER — ENOXAPARIN SODIUM 40 MG/0.4ML ~~LOC~~ SOLN
40.0000 mg | Freq: Every day | SUBCUTANEOUS | Status: DC
  Administered 2019-04-21 – 2019-04-22 (×2): 40 mg via SUBCUTANEOUS
  Filled 2019-04-21 (×2): qty 0.4

## 2019-04-21 MED ORDER — FLUTICASONE FUROATE-VILANTEROL 100-25 MCG/INH IN AEPB
1.0000 | INHALATION_SPRAY | Freq: Every day | RESPIRATORY_TRACT | Status: DC
  Filled 2019-04-21: qty 28

## 2019-04-21 MED ORDER — SENNOSIDES-DOCUSATE SODIUM 8.6-50 MG PO TABS: 1.0000 | ORAL_TABLET | Freq: Every evening | ORAL | Status: DC | PRN

## 2019-04-21 MED ORDER — ONDANSETRON HCL 4 MG PO TABS: 4.0000 mg | ORAL_TABLET | Freq: Four times a day (QID) | ORAL | Status: DC | PRN

## 2019-04-21 MED ORDER — NICOTINE 14 MG/24HR TD PT24
14.0000 mg | MEDICATED_PATCH | Freq: Every day | TRANSDERMAL | Status: DC
  Administered 2019-04-21 – 2019-04-23 (×3): 14 mg via TRANSDERMAL
  Filled 2019-04-21 (×3): qty 1

## 2019-04-21 MED ORDER — KETOROLAC TROMETHAMINE 30 MG/ML IJ SOLN
30.0000 mg | Freq: Four times a day (QID) | INTRAMUSCULAR | Status: DC | PRN
  Administered 2019-04-21 – 2019-04-22 (×2): 30 mg via INTRAVENOUS
  Filled 2019-04-21 (×2): qty 1

## 2019-04-21 MED ORDER — FOLIC ACID 1 MG PO TABS
1.0000 mg | ORAL_TABLET | Freq: Every day | ORAL | Status: DC
  Administered 2019-04-21 – 2019-04-23 (×3): 1 mg via ORAL
  Filled 2019-04-21 (×3): qty 1

## 2019-04-21 MED ORDER — ALBUTEROL SULFATE (2.5 MG/3ML) 0.083% IN NEBU: 2.5000 mg | INHALATION_SOLUTION | Freq: Four times a day (QID) | RESPIRATORY_TRACT | Status: DC | PRN

## 2019-04-21 MED ORDER — CHLORHEXIDINE GLUCONATE CLOTH 2 % EX PADS
6.0000 | MEDICATED_PAD | Freq: Every day | CUTANEOUS | Status: DC
  Administered 2019-04-22 – 2019-04-23 (×3): 6 via TOPICAL

## 2019-04-21 MED ORDER — IOHEXOL 350 MG/ML SOLN
80.0000 mL | Freq: Once | INTRAVENOUS | Status: AC | PRN
  Administered 2019-04-21: 80 mL via INTRAVENOUS

## 2019-04-21 MED ORDER — HYDROCODONE-HOMATROPINE 5-1.5 MG/5ML PO SYRP
5.0000 mL | ORAL_SOLUTION | Freq: Four times a day (QID) | ORAL | Status: DC | PRN
  Administered 2019-04-22 – 2019-04-23 (×5): 5 mL via ORAL
  Filled 2019-04-21 (×5): qty 5

## 2019-04-21 MED ORDER — ORAL CARE MOUTH RINSE
15.0000 mL | Freq: Two times a day (BID) | OROMUCOSAL | Status: DC
  Administered 2019-04-21 – 2019-04-23 (×4): 15 mL via OROMUCOSAL

## 2019-04-21 MED ORDER — HYDROMORPHONE HCL 1 MG/ML IJ SOLN
1.0000 mg | INTRAMUSCULAR | Status: AC | PRN
  Administered 2019-04-21 (×2): 1 mg via INTRAVENOUS
  Filled 2019-04-21 (×2): qty 1

## 2019-04-21 MED ORDER — SODIUM CHLORIDE 0.9% FLUSH: 3.0000 mL | INTRAVENOUS | Status: DC | PRN

## 2019-04-21 MED ORDER — OXYCODONE HCL 5 MG PO TABS
5.0000 mg | ORAL_TABLET | ORAL | Status: DC | PRN
  Administered 2019-04-21 – 2019-04-22 (×4): 5 mg via ORAL
  Filled 2019-04-21 (×5): qty 1

## 2019-04-21 MED ORDER — ONDANSETRON HCL 4 MG/2ML IJ SOLN: 4.0000 mg | Freq: Four times a day (QID) | INTRAMUSCULAR | Status: DC | PRN

## 2019-04-21 MED ORDER — TRAZODONE HCL 50 MG PO TABS: 25.0000 mg | ORAL_TABLET | Freq: Every evening | ORAL | Status: DC | PRN

## 2019-04-21 MED ORDER — BENZONATATE 100 MG PO CAPS
200.0000 mg | ORAL_CAPSULE | Freq: Three times a day (TID) | ORAL | Status: DC | PRN
  Administered 2019-04-21 – 2019-04-22 (×2): 200 mg via ORAL
  Filled 2019-04-21 (×2): qty 2

## 2019-04-21 MED ORDER — SODIUM CHLORIDE 0.9 % IV SOLN: 250.0000 mL | INTRAVENOUS | Status: DC | PRN

## 2019-04-21 NOTE — ED Notes (Signed)
I have just given phone report to [place], [place] [address]. Will transport shortly. He remains in no distress.

## 2019-04-21 NOTE — ED Provider Notes (Signed)
[facility] DEPT Provider Note   CSN: [id number] Arrival date & time: 04/21/19  1148     History Chief Complaint  Patient presents with  . Rt Rib Pain    George Mullen is a 59 y.o. male.  HPI   Pt has history of lung cancer.  He started having pain in his right rib area since Friday.  The pain is sharp and severe.  It increases with breathing, moving, coughing.    No fevers.  No shortness of breath at rest but it does occur when coughing.  Pt feels a crackling in the right rib area.  No vomiting or diarrhea.  He has not started chemo yet.  He tried using a rib belt but it does not to help much when he starts coughing.  Past Medical History:  Diagnosis Date  . Cancer ([place])    lung cancer  . Dyspnea    with exertion  . PONV (postoperative nausea and vomiting)     Patient Active Problem List   Diagnosis Date Noted  . 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 Goals of care, counseling/discussion 04/18/2019  . Encounter for antineoplastic chemotherapy 04/18/2019  . Encounter for antineoplastic immunotherapy 04/18/2019  . S/P bronchoscopy 04/04/2019  . Cough 04/04/2019  . Abnormal findings on diagnostic imaging of lung 04/04/2019  . Former smoker 04/04/2019    Past Surgical History:  Procedure Laterality Date  . APPENDECTOMY    . BRONCHIAL NEEDLE ASPIRATION BIOPSY  04/02/2019   Procedure: BRONCHIAL NEEDLE ASPIRATION BIOPSIES;  Surgeon: Candee Furbish, MD;  Location: [facility] ENDOSCOPY;  Service: Endoscopy;;  . ENDOBRONCHIAL ULTRASOUND  04/02/2019   Procedure: ENDOBRONCHIAL ULTRASOUND;  Surgeon: Candee Furbish, MD;  Location: [facility];  Service: Endoscopy;;  . HIP SURGERY Right   . INGUINAL HERNIA REPAIR    . SHOULDER SURGERY Right   . VIDEO BRONCHOSCOPY N/A 04/02/2019   Procedure: VIDEO BRONCHOSCOPY WITHOUT FLUORO;  Surgeon: Candee Furbish, MD;  Location[facility] END[facility]COPY;  Service: Endoscopy;  Laterality: N/A;  . WISDOM TOOTH  EXTRACTION         Family History  Problem Relation Age of Onset  . COPD Mother   . Lung cancer Maternal Grandmother   . Stomach cancer Paternal Grandmother     Social History   Tobacco Use  . Smoking status: Former Smoker    Packs/day: 1.50    Years: 45.00    Pack years: 67.50    Types: Cigarettes    Quit date: 03/18/2019    Years since quitting: 0.0  . Smokeless tobacco: Never Used  Substance Use Topics  . Alcohol use: Yes    Comment: Occasional Beer  . Drug use: Never    Home Medications Prior to Admission medications   Medication Sig Start Date End Date Taking? Authorizing Provider  acetaminophen (TYLENOL) 500 MG tablet Take 500 mg by mouth every 6 (six) hours as needed.    [provider]  albuterol (PROVENTIL) (2.5 MG/3ML) 0.083% nebulizer solution Take 3 mLs (2.5 mg total) by nebulization every 6 (six) hours as needed for wheezing or shortness of breath. 04/02/19   Candee Furbish, MD  benzonatate (TESSALON) 200 MG capsule Take 1 capsule (200 mg total) by mouth 3 (three) times daily as needed for cough. 04/04/19   Lauraine Rinne, NP  diazepam (VALIUM) 2 MG tablet Take 1 tablet (2 mg total) by mouth every 6 (six) hours as needed for anxiety (Prior to PET  scan; may repeat x1 if needed). Patient not taking: Reported on 04/18/2019 04/04/19   Martyn Ehrich, NP  fluticasone furoate-vilanterol (BREO ELLIPTA) 100-25 MCG/INH AEPB Inhale 1 puff into the lungs daily. 03/27/19   Candee Furbish, MD  fluticasone furoate-vilanterol (BREO ELLIPTA) 100-25 MCG/INH AEPB Inhale 1 puff into the lungs daily. 04/05/19   Lauraine Rinne, NP  folic acid (FOLVITE) 1 MG tablet Take 1 tablet (1 mg total) by mouth daily. 04/18/19   Curt Bears, MD  HYDROcodone-homatropine [facility]) 5-1.5 MG/5ML syrup Take 5 mLs by mouth every 6 (six) hours as needed for cough. 04/02/19   Lauraine Rinne, NP  ondansetron (ZOFRAN ODT) 4 MG disintegrating tablet Take 1 tablet (4 mg total) by mouth every 8  (eight) hours as needed for nausea or vomiting. Patient not taking: Reported on 04/18/2019 04/11/19   Albrizze, Harley Hallmark, PA-C  prochlorperazine (COMPAZINE) 10 MG tablet Take 1 tablet (10 mg total) by mouth every 6 (six) hours as needed for nausea or vomiting. 04/18/19   Curt Bears, MD    Allergies    Patient has no known allergies.  Review of Systems   Review of Systems  Constitutional: Negative for fever.  Gastrointestinal: Negative for abdominal pain.  Genitourinary: Negative for dysuria.  All other systems reviewed and are negative.   Physical Exam Updated Vital Signs BP 112/77   Pulse (!) 110 Comment: on O2 at 2 l.p.m.  Temp 99.6 F (37.6 C) (Oral)   Resp 16   Ht 1.727 m (5\' 8" )   Wt 70 kg   SpO2 90% Comment: on O2 at 2 l.p.m.  BMI 23.46 kg/m   Physical Exam Vitals and nursing note reviewed.  Constitutional:      Appearance: He is well-developed. He is ill-appearing.  HENT:     Head: Normocephalic and atraumatic.     Right Ear: External ear normal.     Left Ear: External ear normal.  Eyes:     General: No scleral icterus.       Right eye: No discharge.        Left eye: No discharge.     Conjunctiva/sclera: Conjunctivae normal.  Neck:     Trachea: No tracheal deviation.  Cardiovascular:     Rate and Rhythm: Normal rate and regular rhythm.  Pulmonary:     Effort: Pulmonary effort is normal. No respiratory distress.     Breath sounds: Normal breath sounds. No stridor. No wheezing, rhonchi or rales.  Chest:     Chest wall: No tenderness.  Abdominal:     General: Bowel sounds are normal. There is no distension.     Palpations: Abdomen is soft.     Tenderness: There is no abdominal tenderness. There is no guarding or rebound.  Musculoskeletal:        General: No tenderness.     Cervical back: Neck supple.  Skin:    General: Skin is warm and dry.     Findings: No rash.  Neurological:     Mental Status: He is alert.     Cranial Nerves: No cranial nerve  deficit (no facial droop, extraocular movements intact, no slurred speech).     Sensory: No sensory deficit.     Motor: No abnormal muscle tone or seizure activity.     Coordination: Coordination normal.     ED Results / Procedures / Treatments   Labs (all labs ordered are listed, but only abnormal results are displayed) Labs Reviewed  CBC WITH  DIFFERENTIAL/PLATELET - Abnormal; Notable for the following components:      Result Value   WBC 12.1 (*)    Neutro Abs 9.4 (*)    Monocytes Absolute 1.2 (*)    All other components within normal limits  COMPREHENSIVE METABOLIC PANEL - Abnormal; Notable for the following components:   Glucose, Bld 111 (*)    Calcium 8.5 (*)    Total Protein 6.3 (*)    Albumin 3.2 (*)    ALT 48 (*)    All other components within normal limits  RESPIRATORY PANEL BY RT PCR (FLU A&B, COVID)    EKG EKG Interpretation  Date/Time:  Sunday April 21 2019 12:45:01 EST Ventricular Rate:  92 PR Interval:    QRS Duration: 84 QT Interval:  322 QTC Calculation: 399 R Axis:   27 Text Interpretation: Sinus rhythm Probable left atrial enlargement No old tracing to compare Confirmed by Dorie Rank [phone]) on 04/21/2019 12:58:40 PM   Radiology CT Angio Chest PE W and/or Wo Contrast  Result Date: 04/21/2019 CLINICAL DATA:  Shortness of breath and near complete opacification of the left hemithorax. EXAM: CT ANGIOGRAPHY CHEST WITH CONTRAST TECHNIQUE: Multidetector CT imaging of the chest was performed using the standard protocol during bolus administration of intravenous contrast. Multiplanar CT image reconstructions and MIPs were obtained to evaluate the vascular anatomy. CONTRAST:  25mL OMNIPAQUE IOHEXOL 350 MG/ML SOLN COMPARISON:  Chest x-ray from earlier in the same day as well as a CT from 04/07/2019 FINDINGS: Cardiovascular: Thoracic aorta demonstrates a normal branching pattern without aneurysmal dilatation or dissection. No cardiac enlargement is seen. The  pulmonary artery is well visualized with a normal branching pattern. Some attenuation of the left upper lobe branches are seen related to the underlying disease process. No definitive filling defect to suggest pulmonary embolism is seen. Mediastinum/Nodes: Thoracic inlet is within normal limits. Scattered small mediastinal lymph nodes are again identified. More prominent soft tissue density is noted in the subcarinal region when compared with the prior exam consistent with adenopathy. This measures approximately 2 cm in dimension. The esophagus as visualized appears within normal limits. Lungs/Pleura: Large left-sided pleural effusion is seen. Considerable consolidation in the upper and lower lobes is noted. Density difference is noted in the left perihilar region consistent with underlying mass measuring approximately 5.7 x 5.5 cm. This has enlarged in size when compared with the prior exam at which time it measured 4.5 x 3.2 cm. No definitive pleural enhancement is seen. As described above there is splaying of the upper lobe pulmonary arterial branches identified. Upper Abdomen: Visualized upper abdomen is within normal limits. Musculoskeletal: Bony structures show mild compression deformity of T11 stable from the prior exam. Review of the MIP images confirms the above findings. IMPRESSION: No evidence of pulmonary embolism. Significant increase in left-sided pleural effusion consistent with the underlying neoplasm. The central left perihilar lesion has increased in size as described above. Associated consolidation is noted as well. Prominent soft tissue density in the subcarinal region is noted consistent with lymphadenopathy. Electronically Signed   By: Inez Catalina M.D.   On: 04/21/2019 14:10   DG Chest Port 1 View  Result Date: 04/21/2019 CLINICAL DATA:  Pt with hx of lung ca, reports right rib pain/chest pain and cough. EXAM: PORTABLE CHEST 1 VIEW COMPARISON:  Chest radiograph 04/05/2019 FINDINGS: The  visualized cardiomediastinal contours are stable. There is near complete opacification of the left hemithorax likely secondary to atelectasis and pleural effusion. Right lung is clear. No acute  finding in the visualized skeleton. IMPRESSION: Near complete opacification of the left hemithorax likely secondary to atelectasis and pleural effusion. Electronically Signed   By: Audie Pinto M.D.   On: 04/21/2019 12:44    Procedures Procedures (including critical care time)  Medications Ordered in ED Medications  HYDROmorphone (DILAUDID) injection 1 mg (1 mg Intravenous Given 04/21/19 1236)  iohexol (OMNIPAQUE) 350 MG/ML injection 80 mL (80 mLs Intravenous Contrast Given 04/21/19 1326)    ED Course  I have reviewed the triage vital signs and the nursing notes.  Pertinent labs & imaging results that were available during my care of the patient were reviewed by me and considered in my medical decision making (see chart for details).  Clinical Course as of Apr 20 1437  Sun Apr 21, 2019  1258 Chest x-ray findings reviewed.  Patient has large left-sided pleural effusion with near complete opacification of the left hemithorax. Will need thoracentesis. Pain however is all on the right side.  Will ct to rule out pe   [JK]  1418 CT scan without PE.  Large pleural effusion and malignancy noted   [JK]  1437 Case discussed with pulmonary, NP [place].  MD will come down to evaluate pt   [JK]    Clinical Course User Index [JK] Dorie Rank, MD   MDM Rules/Calculators/A&P                      Patient presented to ED with complaints of severe right-sided chest pain.  X-ray showed large left-sided pleural effusion associated with his known malignancy.  Because of his right-sided pain CT scan was performed to rule out pulmonary embolism.  CT scan does not show any acute finding on the right side along.  It does redemonstrate the large pleural effusion and malignancy on the left side.  Patient does have a new  oxygen requirement.  He is having significant discomfort and recurrent coughing associated with this large left pleural effusion.  I think he would benefit from thoracentesis and further evaluation and monitoring.  I have consulted the pulmonary service and will consult the medical service for admission and further treatment. Final Clinical Impression(s) / ED Diagnoses Final diagnoses:  Malignant pleural effusion  Pleuritic chest pain      Dorie Rank, MD 04/21/19 1439

## 2019-04-21 NOTE — H&P (Addendum)
History and Physical    George Mullen YCX:[id number] DOB: Mar 25, 1960 DOA: 04/21/2019  Referring MD/NP/PA: Dorie Rank PCP: George Chroman, MD  Outpatient Specialists: George Mullen, Pulmonary Curt Bears, Medical Oncology Patient coming from: home  Chief Complaint:  HPI: George Mullen is a 59 y.o. male with medical history significant of Lung Cancer, recent diagnosis, Stage IVB who comes in with worsening right sided chest pain. Pain has been present since 2 days ago. It is worse with coughing. On cough meds, which are not helping. He denies worsening SOB. Pain feels sharp and is in rib area. Does radiate. Has tried rib belt, which was not helpful. To begin palliative chemo. CXR shows worsening left effusion.  ED Course: Pulmonary consulted, will perform bedside thoracentesis. Patient has new O2 requirement  Review of Systems: As per HPI otherwise 10 point review of systems negative.    Past Medical History:  Diagnosis Date  . Cancer ([place])    lung cancer  . Dyspnea    with exertion  . PONV (postoperative nausea and vomiting)     Past Surgical History:  Procedure Laterality Date  . APPENDECTOMY    . BRONCHIAL NEEDLE ASPIRATION BIOPSY  04/02/2019   Procedure: BRONCHIAL NEEDLE ASPIRATION BIOPSIES;  Surgeon: Candee Furbish, MD;  Location: [facility] ENDOSCOPY;  Service: Endoscopy;;  . ENDOBRONCHIAL ULTRASOUND  04/02/2019   Procedure: ENDOBRONCHIAL ULTRASOUND;  Surgeon: Candee Furbish, MD;  Location: [facility];  Service: Endoscopy;;  . HIP SURGERY Right   . INGUINAL HERNIA REPAIR    . SHOULDER SURGERY Right   . VIDEO BRONCHOSCOPY N/A 04/02/2019   Procedure: VIDEO BRONCHOSCOPY WITHOUT FLUORO;  Surgeon: Candee Furbish, MD;  Location: [facility] ENDOSCOPY;  Service: Endoscopy;  Laterality: N/A;  . WISDOM TOOTH EXTRACTION       reports that he quit smoking about 4 weeks ago. His smoking use included cigarettes. He has a 67.50 pack-year smoking history. He has never used smokeless tobacco. He  reports current alcohol use. He reports that he does not use drugs.  No Known Allergies  Family History  Problem Relation Age of Onset  . COPD Mother   . Lung cancer Maternal Grandmother   . Stomach cancer Paternal Grandmother     Prior to Admission medications   Medication Sig Start Date End Date Taking? Authorizing Provider  acetaminophen (TYLENOL) 500 MG tablet Take 500 mg by mouth every 6 (six) hours as needed.    [provider]  albuterol (PROVENTIL) (2.5 MG/3ML) 0.083% nebulizer solution Take 3 mLs (2.5 mg total) by nebulization every 6 (six) hours as needed for wheezing or shortness of breath. 04/02/19   Candee Furbish, MD  benzonatate (TESSALON) 200 MG capsule Take 1 capsule (200 mg total) by mouth 3 (three) times daily as needed for cough. 04/04/19   Lauraine Rinne, NP  diazepam (VALIUM) 2 MG tablet Take 1 tablet (2 mg total) by mouth every 6 (six) hours as needed for anxiety (Prior to PET scan; may repeat x1 if needed). Patient not taking: Reported on 04/18/2019 04/04/19   Martyn Ehrich, NP  fluticasone furoate-vilanterol (BREO ELLIPTA) 100-25 MCG/INH AEPB Inhale 1 puff into the lungs daily. 03/27/19   Candee Furbish, MD  folic acid (FOLVITE) 1 MG tablet Take 1 tablet (1 mg total) by mouth daily. 04/18/19   Curt Bears, MD  HYDROcodone-homatropine [facility]) 5-1.5 MG/5ML syrup Take 5 mLs by mouth every 6 (six) hours as needed for cough. 04/02/19   Lauraine Rinne, NP  ondansetron (ZOFRAN ODT) 4 MG disintegrating tablet Take 1 tablet (4 mg total) by mouth every 8 (eight) hours as needed for nausea or vomiting. Patient not taking: Reported on 04/18/2019 04/11/19   Albrizze, Harley Hallmark, PA-C  prochlorperazine (COMPAZINE) 10 MG tablet Take 1 tablet (10 mg total) by mouth every 6 (six) hours as needed for nausea or vomiting. 04/18/19   Curt Bears, MD    Physical Exam: Vitals:   04/21/19 1236 04/21/19 1400 04/21/19 1500 04/21/19 1530  BP:  113/84  112/73  Pulse: (!) 110  96 (!) 106 96  Resp:  (!) 21 (!) 29 (!) 24  Temp:      TempSrc:      SpO2: 90% 92% 94% 93%  Weight:      Height:          Constitutional: NAD, appears uncomfortable, ill-appearing Vitals:   04/21/19 1236 04/21/19 1400 04/21/19 1500 04/21/19 1530  BP:  113/84  112/73  Pulse: (!) 110 96 (!) 106 96  Resp:  (!) 21 (!) 29 (!) 24  Temp:      TempSrc:      SpO2: 90% 92% 94% 93%  Weight:      Height:       Eyes: PERRL, lids and conjunctivae normal ENMT: Mucous membranes are moist. .Normal dentition.  Neck: normal, supple, no masses Respiratory: almost no air movement heard on left. Normal sounds on right. Cardiovascular: Regular rate and rhythm, no murmurs / rubs / gallops. No extremity edema. 2+ pedal pulses. No carotid bruits.  Abdomen: no tenderness, no masses palpated. No hepatosplenomegaly. Bowel sounds positive.  Musculoskeletal: no clubbing / cyanosis. No joint deformity upper and lower extremities. Good ROM, no contractures. Normal muscle tone.  Skin: no rashes, lesions, ulcers. No induration Neurologic: Moves all extremeties Sensation intact  Psychiatric: Normal judgment and insight. Alert and oriented x 3. Normal mood.   Labs on Admission: I have personally reviewed following labs and imaging studies  CBC: Recent Labs  Lab 04/18/19 1430 04/21/19 1238  WBC 11.5* 12.1*  NEUTROABS 8.9* 9.4*  HGB 15.0 15.0  HCT 46.1 46.1  MCV 92.0 91.7  PLT 385 956   Basic Metabolic Panel: Recent Labs  Lab 04/18/19 1430 04/21/19 1238  NA 142 135  K 4.0 3.6  CL 105 98  CO2 29 28  GLUCOSE 119* 111*  BUN 22* 18  CREATININE 1.20 1.00  CALCIUM 8.3* 8.5*   GFR: Estimated Creatinine Clearance: 78.9 mL/min (by C-G formula based on SCr of 1 mg/dL). Liver Function Tests: Recent Labs  Lab 04/18/19 1430 04/21/19 1238  AST 13* 22  ALT 37 48*  ALKPHOS 85 82  BILITOT 0.3 0.6  PROT 6.3* 6.3*  ALBUMIN 3.2* 3.2*   Thyroid Function Tests: No results for input(s): TSH, T4TOTAL,  FREET4, T3FREE, THYROIDAB in the last 72 hours.  Radiological Exams on Admission: CT Angio Chest PE W and/or Wo Contrast  Result Date: 04/21/2019 CLINICAL DATA:  Shortness of breath and near complete opacification of the left hemithorax. EXAM: CT ANGIOGRAPHY CHEST WITH CONTRAST TECHNIQUE: Multidetector CT imaging of the chest was performed using the standard protocol during bolus administration of intravenous contrast. Multiplanar CT image reconstructions and MIPs were obtained to evaluate the vascular anatomy. CONTRAST:  38mL OMNIPAQUE IOHEXOL 350 MG/ML SOLN COMPARISON:  Chest x-ray from earlier in the same day as well as a CT from 04/07/2019 FINDINGS: Cardiovascular: Thoracic aorta demonstrates a normal branching pattern without aneurysmal dilatation or dissection. No cardiac enlargement  is seen. The pulmonary artery is well visualized with a normal branching pattern. Some attenuation of the left upper lobe branches are seen related to the underlying disease process. No definitive filling defect to suggest pulmonary embolism is seen. Mediastinum/Nodes: Thoracic inlet is within normal limits. Scattered small mediastinal lymph nodes are again identified. More prominent soft tissue density is noted in the subcarinal region when compared with the prior exam consistent with adenopathy. This measures approximately 2 cm in dimension. The esophagus as visualized appears within normal limits. Lungs/Pleura: Large left-sided pleural effusion is seen. Considerable consolidation in the upper and lower lobes is noted. Density difference is noted in the left perihilar region consistent with underlying mass measuring approximately 5.7 x 5.5 cm. This has enlarged in size when compared with the prior exam at which time it measured 4.5 x 3.2 cm. No definitive pleural enhancement is seen. As described above there is splaying of the upper lobe pulmonary arterial branches identified. Upper Abdomen: Visualized upper abdomen is  within normal limits. Musculoskeletal: Bony structures show mild compression deformity of T11 stable from the prior exam. Review of the MIP images confirms the above findings. IMPRESSION: No evidence of pulmonary embolism. Significant increase in left-sided pleural effusion consistent with the underlying neoplasm. The central left perihilar lesion has increased in size as described above. Associated consolidation is noted as well. Prominent soft tissue density in the subcarinal region is noted consistent with lymphadenopathy. Electronically Signed   By: Inez Catalina M.D.   On: 04/21/2019 14:10   DG CHEST PORT 1 VIEW  Result Date: 04/21/2019 CLINICAL DATA:  Status post LEFT thoracentesis. EXAM: PORTABLE CHEST 1 VIEW COMPARISON:  Plain film from earlier same day. FINDINGS: Persistent near complete opacification of the LEFT hemithorax. RIGHT lung is clear. No pneumothorax seen. IMPRESSION: Persistent near complete opacification of the LEFT hemithorax. No pneumothorax seen. Electronically Signed   By: Franki Cabot M.D.   On: 04/21/2019 17:37   DG Chest Port 1 View  Result Date: 04/21/2019 CLINICAL DATA:  Pt with hx of lung ca, reports right rib pain/chest pain and cough. EXAM: PORTABLE CHEST 1 VIEW COMPARISON:  Chest radiograph 04/05/2019 FINDINGS: The visualized cardiomediastinal contours are stable. There is near complete opacification of the left hemithorax likely secondary to atelectasis and pleural effusion. Right lung is clear. No acute finding in the visualized skeleton. IMPRESSION: Near complete opacification of the left hemithorax likely secondary to atelectasis and pleural effusion. Electronically Signed   By: Audie Pinto M.D.   On: 04/21/2019 12:44    EKG: Independently reviewed. LAE, sinus rhythm  Assessment/Plan Active Problems:   Former smoker   Non-small cell cancer of left lung ([facility])   Right-sided chest pain   Malignant pleural effusion   Hypoxia  Hypoxia - suspect worsening  due to malignant effusion. Pulmonary to tap the patient.  Malignant Pleural Effusion - as above.  Right sided chest pain - likely has pulled an intercostal muscle due to cough, will continue tessalon and cough meds with codeine suppressant.  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  Former Smoker - on Nicoderm patch, stepped down from 21 mg to 14 today as has been on the 21 x 4 wks.  DVT prophylaxis: Lovenox Code Status:  Full Family Communication: Wife, at bedside Disposition Plan: Home Consults called: Pulmonary - [place] Admission status: inpatient  Severity of Illness: The appropriate  patient status for this patient is OBSERVATION. Observation status is judged to be reasonable and necessary in order to provide the required intensity of service to ensure the patient's safety. The patient's presenting symptoms, physical exam findings, and initial radiographic and laboratory data in the context of their medical condition is felt to place them at decreased risk for further clinical deterioration. Furthermore, it is anticipated that the patient will be medically stable for discharge from the hospital within 2 midnights of admission. The following factors support the patient status of observation.   " The patient's presenting symptoms include hypoxia, chest pain. " The physical exam findings include no air movement on the left. " The initial radiographic and laboratory data are almost complete opacification on the left.   Donnamae Jude MD [facility] Pager [phone]  If 7PM-7AM, please contact night-coverage [email or web address] Password [facility]  04/21/2019, 5:45 PM

## 2019-04-21 NOTE — ED Triage Notes (Signed)
Pt has lung cancer, rt rib area pain started Friday, unable to eat or drink much, ? dehydration

## 2019-04-21 NOTE — Plan of Care (Signed)
Contacted ED physician, Dorie Rank, MD who is seeing George Mullen. Patient's vitals stable. Will plan on obtaining chest imaging. If no abnormalities, patient is already arrange for outpatient visit with his primary pulmonologist, Dr. Erskine Emery, tomorrow.  Pulmonary team available as needed.  Rodman Pickle, M.D. [facility] Pulmonary/Critical Care Medicine 04/21/2019 12:26 PM

## 2019-04-21 NOTE — Consult Note (Signed)
NAME:  George Mullen, MRN:  [id number], DOB:  02/02/1961, LOS: 0 ADMISSION DATE:  04/21/2019, CONSULTATION DATE:  04/21/19 REFERRING MD:  Marye Round, MD CHIEF COMPLAINT:  Left pleural effusion  Brief History   59 year old male with NSCLC admitted for acute hypoxemic respiratory failure secondary left pleural effusion  History of present illness   George Mullen is a 59 year old male former smoker with newly diagnosed metastatic non-small cell lung cancer with bone mets initially diagnosed on bronchoscopy 04/02/19. Two days ago, he began having sharp right-sided rib pain aggravated by cough and movement. Cough is non-productive and also began in the same time interval. Symptoms associated with shortness of breath. Denies recent fevers, chills, wheezing.  In the ED, vitals overall stable except for low-normal oxygen on 2L O2 and tachypnea. Patient appears uncomfortable with movement or deep breaths. Reports his chest pain is his worst symptom.  Past Medical History  Non-small cell lung cancer  Significant Hospital Events   2/14 Admitted  Consults:  PCCM  Procedures:  Pending thora 2/14  Significant Diagnostic Tests:  CTA 04/21/19 - Large left pleural effusion, mediastinal and left hilar lymphadenopathy  Micro Data:    Antimicrobials:    Interim history/subjective:    Objective   Blood pressure 112/77, pulse (!) 110, temperature 99.6 F (37.6 C), temperature source Oral, resp. rate 16, height 5\' 8"  (1.727 m), weight 70 kg, SpO2 90 %.       No intake or output data in the 24 hours ending 04/21/19 1513 Filed Weights   04/21/19 1156  Weight: 70 kg   Physical Exam: General: Chronically ill-appearing, in mild discomfort HENT: [place], AT, OP clear, MMM Eyes: EOMI, no scleral icterus Respiratory: Absent left breath sounds. No wheezing or rhonchi Cardiovascular: RRR, -M/R/G, no JVD GI: BS+, soft, nontender Extremities:-Edema,-tenderness Neuro: AAO x4, CNII-XII grossly intact  Skin: Intact, no rashes or bruising Psych: Normal mood, normal affect  Resolved Hospital Problem list    Assessment & Plan:   Acute hypoxemic respiratory failure secondary to left pleural effusion, presumed malignant in setting in non-small cell lung cancer - Consented patient for bedside thoracentesis. Discussed risk of recurrence as this is likely related to underlying cancer. If recurs, will consider PleurX catheter in the future. I extensively addressed patient and wife's questions. Communicated plan with ED physician. - Will send thoracentesis labs (cell count, culture, cytology, LDH, protein and glucose) - Plan for post-procedure and AM CXR - Wean supplemental oxygen for goal SpO2 >90% - Pain control per primary team  Non small cell lung cancer - Followed by Dr. Julien Nordmann as outpatient - Scheduled to start Pembrolizumab, pemetrexed, carboplatin. Has not started  Best practice:  Diet: Regular Pain/Anxiety/Delirium protocol (if indicated): Per primary VAP protocol (if indicated): -- DVT prophylaxis: Hold until after thoracentesis GI prophylaxis: Per primary Glucose control: Per primary Mobility: As tolerated Code Status: Full Family Communication: Updated patient and wife at bedside Disposition: Admit to SDU  Labs   CBC: Recent Labs  Lab 04/18/19 1430 04/21/19 1238  WBC 11.5* 12.1*  NEUTROABS 8.9* 9.4*  HGB 15.0 15.0  HCT 46.1 46.1  MCV 92.0 91.7  PLT 385 109    Basic Metabolic Panel: Recent Labs  Lab 04/18/19 1430 04/21/19 1238  NA 142 135  K 4.0 3.6  CL 105 98  CO2 29 28  GLUCOSE 119* 111*  BUN 22* 18  CREATININE 1.20 1.00  CALCIUM 8.3* 8.5*   GFR: Estimated Creatinine Clearance: 78.9 mL/min (  by C-G formula based on SCr of 1 mg/dL). Recent Labs  Lab 04/18/19 1430 04/21/19 1238  WBC 11.5* 12.1*    Liver Function Tests: Recent Labs  Lab 04/18/19 1430 04/21/19 1238  AST 13* 22  ALT 37 48*  ALKPHOS 85 82  BILITOT 0.3 0.6  PROT 6.3* 6.3*   ALBUMIN 3.2* 3.2*   No results for input(s): LIPASE, AMYLASE in the last 168 hours. No results for input(s): AMMONIA in the last 168 hours.  ABG No results found for: PHART, PCO2ART, PO2ART, HCO3, TCO2, ACIDBASEDEF, O2SAT   Coagulation Profile: No results for input(s): INR, PROTIME in the last 168 hours.  Cardiac Enzymes: No results for input(s): CKTOTAL, CKMB, CKMBINDEX, TROPONINI in the last 168 hours.  HbA1C: No results found for: HGBA1C  CBG: No results for input(s): GLUCAP in the last 168 hours.  Review of Systems:   Review of Systems  Constitutional: Positive for malaise/fatigue. Negative for chills and fever.  HENT: Negative for congestion and sore throat.   Respiratory: Positive for cough and shortness of breath. Negative for hemoptysis, sputum production and wheezing.   Cardiovascular: Positive for chest pain. Negative for palpitations and leg swelling.  Gastrointestinal: Negative for abdominal pain, heartburn and nausea.  Musculoskeletal: Positive for back pain. Negative for myalgias.  Neurological: Negative for dizziness and headaches.     Past Medical History  He,  has a past medical history of Cancer ([place]), Dyspnea, and PONV (postoperative nausea and vomiting).   Surgical History    Past Surgical History:  Procedure Laterality Date  . APPENDECTOMY    . BRONCHIAL NEEDLE ASPIRATION BIOPSY  04/02/2019   Procedure: BRONCHIAL NEEDLE ASPIRATION BIOPSIES;  Surgeon: Candee Furbish, MD;  Location: [facility] ENDOSCOPY;  Service: Endoscopy;;  . ENDOBRONCHIAL ULTRASOUND  04/02/2019   Procedure: ENDOBRONCHIAL ULTRASOUND;  Surgeon: Candee Furbish, MD;  Location: [place];  Service: Endoscopy;;  . HIP SURGERY Right   . INGUINAL HERNIA REPAIR    . SHOULDER SURGERY Right   . VIDEO BRONCHOSCOPY N/A 04/02/2019   Procedure: VIDEO BRONCHOSCOPY WITHOUT FLUORO;  Surgeon: Candee Furbish, MD;  [facility];  Service: Endoscopy;  Laterality: N/A;  . WISDOM TOOTH EXTRACTION        Social History   reports that he quit smoking about 4 weeks ago. His smoking use included cigarettes. He has a 67.50 pack-year smoking history. He has never used smokeless tobacco. He reports current alcohol use. He reports that he does not use drugs.   Family History   His family history includes COPD in his mother; Lung cancer in his maternal grandmother; Stomach cancer in his paternal grandmother.   Allergies No Known Allergies   Home Medications  Prior to Admission medications   Medication Sig Start Date End Date Taking? Authorizing Provider  acetaminophen (TYLENOL) 500 MG tablet Take 500 mg by mouth every 6 (six) hours as needed.    [provider]  albuterol (PROVENTIL) (2.5 MG/3ML) 0.083% nebulizer solution Take 3 mLs (2.5 mg total) by nebulization every 6 (six) hours as needed for wheezing or shortness of breath. 04/02/19   Candee Furbish, MD  benzonatate (TESSALON) 200 MG capsule Take 1 capsule (200 mg total) by mouth 3 (three) times daily as needed for cough. 04/04/19   Lauraine Rinne, NP  diazepam (VALIUM) 2 MG tablet Take 1 tablet (2 mg total) by mouth every 6 (six) hours as needed for anxiety (Prior to PET scan; may repeat x1 if needed). Patient not  taking: Reported on 04/18/2019 04/04/19   Martyn Ehrich, NP  fluticasone furoate-vilanterol (BREO ELLIPTA) 100-25 MCG/INH AEPB Inhale 1 puff into the lungs daily. 03/27/19   Candee Furbish, MD  fluticasone furoate-vilanterol (BREO ELLIPTA) 100-25 MCG/INH AEPB Inhale 1 puff into the lungs daily. 04/05/19   Lauraine Rinne, NP  folic acid (FOLVITE) 1 MG tablet Take 1 tablet (1 mg total) by mouth daily. 04/18/19   Curt Bears, MD  HYDROcodone-homatropine [facility]) 5-1.5 MG/5ML syrup Take 5 mLs by mouth every 6 (six) hours as needed for cough. 04/02/19   Lauraine Rinne, NP  ondansetron (ZOFRAN ODT) 4 MG disintegrating tablet Take 1 tablet (4 mg total) by mouth every 8 (eight) hours as needed for nausea or vomiting. Patient  not taking: Reported on 04/18/2019 04/11/19   Albrizze, Harley Hallmark, PA-C  prochlorperazine (COMPAZINE) 10 MG tablet Take 1 tablet (10 mg total) by mouth every 6 (six) hours as needed for nausea or vomiting. 04/18/19   Curt Bears, MD     Care time: [place] min    Rodman Pickle, M.D. [facility] Pulmonary/Critical Care Medicine 04/21/2019 3:14 PM

## 2019-04-21 NOTE — Progress Notes (Signed)
Pt arrived to unit from ED via stretcher. VSS. Currently on 2L O2 via [place]. Voiced left sided rib pain at a 7/10. Gave PRN pain medication as ordered. Educated on safety protocols and call light system. Oriented pt to room. No skin issues or open areas noted. Dry non-productive cough. Pt resting in bed with call light within reach. Will cont to mx.

## 2019-04-21 NOTE — Procedures (Signed)
Thoracentesis Procedure Note  Pre-operative Diagnosis: Left pleural effusion  Post-operative Diagnosis: same  Indications: Left pleural effusion, hypoxemia  Procedure Details  Consent: Informed consent was obtained. Risks of the procedure were discussed including: infection, bleeding, pain, pneumothorax.  Under sterile conditions the patient was positioned. Betadine solution and sterile drapes were utilized.  1% buffered lidocaine was used to anesthetize the 7th rib space. Fluid was obtained without any difficulties and minimal blood loss.  A dressing was applied to the wound and wound care instructions were provided.   Findings 1.9 ml of serosanguinous pleural fluid was obtained. A sample was sent to Pathology for cytogenetics, flow, and cell counts, as well as for infection analysis.  Complications:  None; patient tolerated the procedure well.          Condition: stable  Plan A follow up chest x-ray was ordered. Tylenol 650 mg. for pain.  Attending Attestation: I performed the procedure.  Rodman Pickle, M.D. [facility] Pulmonary/Critical Care Medicine 04/21/2019 5:12 PM

## 2019-04-22 ENCOUNTER — Encounter: Payer: Self-pay | Admitting: *Deleted

## 2019-04-22 ENCOUNTER — Ambulatory Visit: Payer: Managed Care, Other (non HMO) | Admitting: Internal Medicine

## 2019-04-22 ENCOUNTER — Inpatient Hospital Stay (HOSPITAL_COMMUNITY): Payer: Managed Care, Other (non HMO)

## 2019-04-22 DIAGNOSIS — R0902 Hypoxemia: Secondary | ICD-10-CM

## 2019-04-22 LAB — BASIC METABOLIC PANEL
Anion gap: 8 (ref 5–15)
BUN: 24 mg/dL — ABNORMAL HIGH (ref 6–20)
CO2: 26 mmol/L (ref 22–32)
Calcium: 8.2 mg/dL — ABNORMAL LOW (ref 8.9–10.3)
Chloride: 102 mmol/L (ref 98–111)
Creatinine, Ser: 1.15 mg/dL (ref 0.61–1.24)
GFR calc Af Amer: >60 mL/min (ref >60)
GFR calc non Af Amer: >60 mL/min (ref >60)
Glucose, Bld: 102 mg/dL — ABNORMAL HIGH (ref 70–99)
Potassium: 4.1 mmol/L (ref 3.5–5.1)
Sodium: 136 mmol/L (ref 135–145)

## 2019-04-22 LAB — HEMOGLOBIN AND HEMATOCRIT, BLOOD
HCT: 47.3 % (ref 39.0–52.0)
Hemoglobin: 15.4 g/dL (ref 13.0–17.0)

## 2019-04-22 LAB — CBC
HCT: 45.5 % (ref 39.0–52.0)
Hemoglobin: 14.6 g/dL (ref 13.0–17.0)
MCH: 29.6 pg (ref 26.0–34.0)
MCHC: 32.1 g/dL (ref 30.0–36.0)
MCV: 92.1 fL (ref 80.0–100.0)
Platelets: 326 10*3/uL (ref 150–400)
RBC: 4.94 MIL/uL (ref 4.22–5.81)
RDW: 13.1 % (ref 11.5–15.5)
WBC: 11.6 10*3/uL — ABNORMAL HIGH (ref 4.0–10.5)
nRBC: 0 % (ref 0.0–0.2)

## 2019-04-22 LAB — HIV ANTIBODY (ROUTINE TESTING W REFLEX): HIV Screen 4th Generation wRfx: NONREACTIVE

## 2019-04-22 MED ORDER — OXYCODONE HCL 5 MG PO TABS
5.0000 mg | ORAL_TABLET | Freq: Once | ORAL | Status: AC
  Administered 2019-04-22: 5 mg via ORAL
  Filled 2019-04-22: qty 1

## 2019-04-22 MED ORDER — LABETALOL HCL 5 MG/ML IV SOLN: 5.0000 mg | INTRAVENOUS | Status: DC | PRN

## 2019-04-22 MED ORDER — SENNOSIDES-DOCUSATE SODIUM 8.6-50 MG PO TABS
1.0000 | ORAL_TABLET | Freq: Every day | ORAL | Status: DC
  Administered 2019-04-22 – 2019-04-23 (×2): 1 via ORAL
  Filled 2019-04-22 (×2): qty 1

## 2019-04-22 MED ORDER — HYDROMORPHONE HCL 1 MG/ML IJ SOLN
0.4000 mg | Freq: Four times a day (QID) | INTRAMUSCULAR | Status: DC | PRN
  Administered 2019-04-22 – 2019-04-23 (×3): 0.4 mg via INTRAVENOUS
  Filled 2019-04-22 (×3): qty 1

## 2019-04-22 NOTE — Progress Notes (Signed)
PROGRESS NOTE    Crystian Frith  KCM:[id number] DOB: 05/29/1960 DOA: 04/21/2019 PCP: Glenda Chroman, MD    Brief Narrative:  59 y.o. male with medical history significant of Lung Cancer, recent diagnosis, Stage IVB who comes in with worsening right sided chest pain. Pain has been present since 2 days ago. It is worse with coughing. On cough meds, which are not helping. He denies worsening SOB. Pain feels sharp and is in rib area. Does radiate. Has tried rib belt, which was not helpful. To begin palliative chemo. CXR shows worsening left effusion.  ED Course: Pulmonary consulted, will perform bedside thoracentesis. Patient has new O2 requirement  Assessment & Plan:   Active Problems:   Former smoker   Non-small cell cancer of left lung ([place])   Right-sided chest pain   Malignant pleural effusion   Hypoxia   Acute hypoxemic respiratory failure  -Pt is s/p thoracentesis 2/14 per PCCM with rapid re-accumilation noted on 2/15, CXR reviewed -Fluid analysis consistent with malignant effusion, non-small cell cancer, favoring adenocarcinoma -Repeat CXR this AM per PCCM reviewed. F/u CXR with findings of only minimal L airspace seen, concern for continued large effusion -Discussed with PCCM. As malignancy is confirmed, plan for Pleurix placement -CM following to assist with supplies for home -Oncology following.  -Patient remains on 3.5LNC, O2 naive  Malignant Pleural Effusion  - plan as above.  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  - worsening size of tumor, pt noted to be planned for palliative chemo starting next week -Oncology following  Former Smoker  - continued on Nicoderm patch 14mg  daily  Leukocytosis -Unclear etiology, improving -repeat CBC in AM  Tachycardia - Likely related to large effusion - Will order PRN beta blocker for  HR>120 with hold parameters  DVT prophylaxis: Lovenox subq Code Status: Full Family Communication: Pt in room, family not at bedside Disposition Plan: From home, d/c home when effusion resolved and no longer O2 dependent  Consultants:   PCCM  Procedures:   Thoracentesis 2/14, 2/15  Antimicrobials: Anti-infectives (From admission, onward)   None       Subjective: Feeling sob this AM  Objective: Vitals:   04/22/19 1300 04/22/19 1400 04/22/19 1500 04/22/19 1600  BP: 112/78 109/82 129/83 124/76  Pulse: 97 (!) 106 (!) 130 (!) 103  Resp: 18 (!) 24 19 (!) 24  Temp:      TempSrc:      SpO2: 92% 94% 92% 90%  Weight:      Height:        Intake/Output Summary (Last 24 hours) at 04/22/2019 1635 Last data filed at 04/22/2019 1400 Gross per 24 hour  Intake 1440 ml  Output 3375 ml  Net -1935 ml   Filed Weights   04/21/19 1156  Weight: 70 kg    Examination:  General exam: Appears calm and comfortable  Respiratory system: Decreased BS on L, Respiratory effort increased Cardiovascular system: S1 & S2 heard, Regular Gastrointestinal system: Abdomen is nondistended, soft and nontender. No organomegaly or masses felt. Normal bowel sounds heard. Central nervous system: Alert and oriented. No focal neurological deficits. Extremities: Symmetric 5 x 5 power. Skin: No rashes, lesions  Psychiatry: Judgement and insight appear normal. Mood & affect appropriate.   Data Reviewed: I have personally reviewed following labs and imaging studies  CBC: Recent Labs  Lab 04/18/19 1430 04/21/19  1238 04/22/19 0308 04/22/19 1517  WBC 11.5* 12.1* 11.6*  --   NEUTROABS 8.9* 9.4*  --   --   HGB 15.0 15.0 14.6 15.4  HCT 46.1 46.1 45.5 47.3  MCV 92.0 91.7 92.1  --   PLT 385 340 326  --    Basic Metabolic Panel: Recent Labs  Lab 04/18/19 1430 04/21/19 1238 04/22/19 0308  NA 142 135 136  K 4.0 3.6 4.1  CL 105 98 102  CO2 29 28 26   GLUCOSE 119* 111* 102*  BUN 22* 18 24*    CREATININE 1.20 1.00 1.15  CALCIUM 8.3* 8.5* 8.2*   GFR: Estimated Creatinine Clearance: 68.6 mL/min (by C-G formula based on SCr of 1.15 mg/dL). Liver Function Tests: Recent Labs  Lab 04/18/19 1430 04/21/19 1238 04/21/19 1723  AST 13* 22  --   ALT 37 48*  --   ALKPHOS 85 82  --   BILITOT 0.3 0.6  --   PROT 6.3* 6.3* 6.3*  ALBUMIN 3.2* 3.2*  --    No results for input(s): LIPASE, AMYLASE in the last 168 hours. No results for input(s): AMMONIA in the last 168 hours. Coagulation Profile: No results for input(s): INR, PROTIME in the last 168 hours. Cardiac Enzymes: No results for input(s): CKTOTAL, CKMB, CKMBINDEX, TROPONINI in the last 168 hours. BNP (last 3 results) No results for input(s): PROBNP in the last 8760 hours. HbA1C: No results for input(s): HGBA1C in the last 72 hours. CBG: No results for input(s): GLUCAP in the last 168 hours. Lipid Profile: No results for input(s): CHOL, HDL, LDLCALC, TRIG, CHOLHDL, LDLDIRECT in the last 72 hours. Thyroid Function Tests: No results for input(s): TSH, T4TOTAL, FREET4, T3FREE, THYROIDAB in the last 72 hours. Anemia Panel: No results for input(s): VITAMINB12, FOLATE, FERRITIN, TIBC, IRON, RETICCTPCT in the last 72 hours. Sepsis Labs: No results for input(s): PROCALCITON, LATICACIDVEN in the last 168 hours.  Recent Results (from the past 240 hour(s))  Respiratory Panel by RT PCR (Flu A&B, Covid) - Nasopharyngeal Swab     Status: None   Collection Time: 04/21/19  2:57 PM   Specimen: Nasopharyngeal Swab  Result Value Ref Range Status   SARS Coronavirus 2 by RT PCR NEGATIVE NEGATIVE Final    Comment: (NOTE) SARS-CoV-2 target nucleic acids are NOT DETECTED. The SARS-CoV-2 RNA is generally detectable in upper respiratoy specimens during the acute phase of infection. The lowest concentration of SARS-CoV-2 viral copies this assay can detect is 131 copies/mL. A negative result does not preclude SARS-Cov-2 infection and should not  be used as the sole basis for treatment or other patient management decisions. A negative result may occur with  improper specimen collection/handling, submission of specimen other than nasopharyngeal swab, presence of viral mutation(s) within the areas targeted by this assay, and inadequate number of viral copies (<131 copies/mL). A negative result must be combined with clinical observations, patient history, and epidemiological information. The expected result is Negative. Fact Sheet for Patients:  PinkCheek.be Fact Sheet for Healthcare Providers:  GravelBags.it This test is not yet ap proved or cleared by the [place] FDA and  has been authorized for detection and/or diagnosis of SARS-CoV-2 by FDA under an Emergency Use Authorization (EUA). This EUA will remain  in effect (meaning this test can be used) for the duration of the COVID-19 declaration under Section 564(b)(1) of the Act, 21 U.S.C. section 360bbb-3(b)(1), unless the authorization is terminated or revoked sooner.    Influenza A by PCR NEGATIVE NEGATIVE  Final   Influenza B by PCR NEGATIVE NEGATIVE Final    Comment: (NOTE) The Xpert Xpress SARS-CoV-2/FLU/RSV assay is intended as an aid in  the diagnosis of influenza from Nasopharyngeal swab specimens and  should not be used as a sole basis for treatment. Nasal washings and  aspirates are unacceptable for Xpert Xpress SARS-CoV-2/FLU/RSV  testing. Fact Sheet for Patients: PinkCheek.be Fact Sheet for Healthcare Providers: GravelBags.it This test is not yet approved or cleared by the [place] FDA and  has been authorized for detection and/or diagnosis of SARS-CoV-2 by  FDA under an Emergency Use Authorization (EUA). This EUA will remain  in effect (meaning this test can be used) for the duration of the  Covid-19 declaration under Section 564(b)(1) of  the Act, 21  U.S.C. section 360bbb-3(b)(1), unless the authorization is  terminated or revoked. Performed at [facility], [place] [address], [place], [place] [address]   Body fluid culture (includes gram stain)     Status: None (Preliminary result)   Collection Time: 04/21/19  5:08 PM   Specimen: Pleural Fluid  Result Value Ref Range Status   Specimen Description   Final    PLEURAL Performed at [facility] [address], [place], [place] [address]    Special Requests   Final    Immunocompromised Performed at [facility], [place] [address] [place], [place] [address]    Gram Stain   Final    RARE WBC PRESENT, PREDOMINANTLY MONONUCLEAR NO ORGANISMS SEEN    Culture   Final    NO GROWTH < 12 HOURS Performed at [facility] [address] [place], [place] [address]    Report Status PENDING  Incomplete     Radiology Studies: CT Angio Chest PE W and/or Wo Contrast  Result Date: 04/21/2019 CLINICAL DATA:  Shortness of breath and near complete opacification of the left hemithorax. EXAM: CT ANGIOGRAPHY CHEST WITH CONTRAST TECHNIQUE: Multidetector CT imaging of the chest was performed using the standard protocol during bolus administration of intravenous contrast. Multiplanar CT image reconstructions and MIPs were obtained to evaluate the vascular anatomy. CONTRAST:  30mL OMNIPAQUE IOHEXOL 350 MG/ML SOLN COMPARISON:  Chest x-ray from earlier in the same day as well as a CT from 04/07/2019 FINDINGS: Cardiovascular: Thoracic aorta demonstrates a normal branching pattern without aneurysmal dilatation or dissection. No cardiac enlargement is seen. The pulmonary artery is well visualized with a normal branching pattern. Some attenuation of the left upper lobe branches are seen related to the underlying disease process. No definitive filling defect to suggest pulmonary embolism is seen. Mediastinum/Nodes: Thoracic inlet is within normal  limits. Scattered small mediastinal lymph nodes are again identified. More prominent soft tissue density is noted in the subcarinal region when compared with the prior exam consistent with adenopathy. This measures approximately 2 cm in dimension. The esophagus as visualized appears within normal limits. Lungs/Pleura: Large left-sided pleural effusion is seen. Considerable consolidation in the upper and lower lobes is noted. Density difference is noted in the left perihilar region consistent with underlying mass measuring approximately 5.7 x 5.5 cm. This has enlarged in size when compared with the prior exam at which time it measured 4.5 x 3.2 cm. No definitive pleural enhancement is seen. As described above there is splaying of the upper lobe pulmonary arterial branches identified. Upper Abdomen: Visualized upper abdomen is within normal limits. Musculoskeletal: Bony structures show mild compression deformity of T11 stable from the prior exam. Review of  the MIP images confirms the above findings. IMPRESSION: No evidence of pulmonary embolism. Significant increase in left-sided pleural effusion consistent with the underlying neoplasm. The central left perihilar lesion has increased in size as described above. Associated consolidation is noted as well. Prominent soft tissue density in the subcarinal region is noted consistent with lymphadenopathy. Electronically Signed   By: Inez Catalina M.D.   On: 04/21/2019 14:10   DG CHEST PORT 1 VIEW  Result Date: 04/22/2019 CLINICAL DATA:  Status post thoracentesis EXAM: PORTABLE CHEST 1 VIEW COMPARISON:  04/22/2019 FINDINGS: Cardiac shadow is obscured by the large left pleural effusion. Only minimal aeration of the left upper lobe is noted. Continued opacification of the left hemithorax is seen consistent with the large effusion and underlying mass lesion. Right lung demonstrates mild right basilar atelectasis. No pneumothorax is seen. IMPRESSION: Stable appearance of the  chest. Electronically Signed   By: Inez Catalina M.D.   On: 04/22/2019 11:42   DG CHEST PORT 1 VIEW  Result Date: 04/22/2019 CLINICAL DATA:  Pleural effusion. EXAM: PORTABLE CHEST 1 VIEW COMPARISON:  Radiograph and CT yesterday FINDINGS: Large left pleural effusion with near complete opacification the left hemithorax. Minimal portion of the left upper lobe is aerated. Mediastinal contours are obscured. Minimal patchy right lung base opacities likely atelectasis. No evidence of pulmonary edema. No pneumothorax. IMPRESSION: Large left pleural effusion with near complete opacification of the left hemithorax. Minimal portion of the left upper lobe aerated. Minimal right lung base atelectasis. Electronically Signed   By: Keith Rake M.D.   On: 04/22/2019 04:39   DG CHEST PORT 1 VIEW  Result Date: 04/21/2019 CLINICAL DATA:  Status post LEFT thoracentesis. EXAM: PORTABLE CHEST 1 VIEW COMPARISON:  Plain film from earlier same day. FINDINGS: Persistent near complete opacification of the LEFT hemithorax. RIGHT lung is clear. No pneumothorax seen. IMPRESSION: Persistent near complete opacification of the LEFT hemithorax. No pneumothorax seen. Electronically Signed   By: Franki Cabot M.D.   On: 04/21/2019 17:37   DG Chest Port 1 View  Result Date: 04/21/2019 CLINICAL DATA:  Pt with hx of lung ca, reports right rib pain/chest pain and cough. EXAM: PORTABLE CHEST 1 VIEW COMPARISON:  Chest radiograph 04/05/2019 FINDINGS: The visualized cardiomediastinal contours are stable. There is near complete opacification of the left hemithorax likely secondary to atelectasis and pleural effusion. Right lung is clear. No acute finding in the visualized skeleton. IMPRESSION: Near complete opacification of the left hemithorax likely secondary to atelectasis and pleural effusion. Electronically Signed   By: Audie Pinto M.D.   On: 04/21/2019 12:44    Scheduled Meds: . Chlorhexidine Gluconate Cloth  6 each Topical Daily    . enoxaparin (LOVENOX) injection  40 mg Subcutaneous QHS  . fluticasone furoate-vilanterol  1 puff Inhalation Daily  . folic acid  1 mg Oral Daily  . mouth rinse  15 mL Mouth Rinse BID  . nicotine  14 mg Transdermal Daily  . senna-docusate  1 tablet Oral Daily  . sodium chloride flush  3 mL Intravenous Q12H   Continuous Infusions: . sodium chloride       LOS: 1 day   Marylu Lund, MD [facility] Pager On Amion  If 7PM-7AM, please contact night-coverage 04/22/2019, 4:35 PM

## 2019-04-22 NOTE — Progress Notes (Signed)
Contacted [employer] to inquire about insurance coverage for PleurX supplies. Spoke with Juliann Pulse and she requested the pt's facesheet with pt's insurance information. She reports that they will f/u with coverage tomorrow. Facesheet faxed and received confirmation that the fax was received. Will continue to f/u to assist with the D/C plan.

## 2019-04-22 NOTE — Progress Notes (Signed)
Oncology Nurse Navigator Documentation  Oncology Nurse Navigator Flowsheets 04/22/2019  Abnormal Finding Date -  Confirmed Diagnosis Date -  Diagnosis Status -  Navigator Location [facility]  Referral Date to RadOnc/MedOnc -  Navigator Encounter Type Other/I checked on patient's [employer] test results.  Per the [employer] portal, they have received his testing sample.  Dr. Coralyn Mark is in the hospital and Dr. Julien Nordmann is updated.   Telephone -  [facility] Date -  Multidisiplinary Clinic Type -  Patient Visit Type -  Treatment Phase Pre-Tx/Tx Discussion  Barriers/Navigation Needs Coordination of Care  Education -  Interventions Coordination of Care  Acuity Level 2-Minimal Needs (1-2 Barriers Identified)  Coordination of Care Other  Education Method -  Time Spent with Patient 15

## 2019-04-22 NOTE — Progress Notes (Signed)
HEMATOLOGY-ONCOLOGY PROGRESS NOTE  SUBJECTIVE: The patient presented to the hospital with worsening right-sided chest pain.  He had been using cough medication which was not helping.  He also complained of pain in his rib area which does not radiate.  Chest x-ray showed a large left pleural effusion.  The patient was also toxic and was started on O2.  He was seen by pulmonology and underwent a left thoracentesis with 1.9 L of serosanguineous fluid removed.  This has been sent to pathology and cytology is currently pending.  Repeat chest x-ray today shows reaccumulation of fluid on the left side.  He is for repeat thoracentesis today.  He still reports chest discomfort and shortness of breath.  Having coughing fits which makes the pain worse.  Denies hemoptysis.  He has no other complaints today.  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[place])   04/30/2019 -  Chemotherapy   The patient had palonosetron (ALOXI) injection 0.25 mg, 0.25 mg, Intravenous,  Once, 0 of 4 cycles PEMEtrexed (ALIMTA) 900 mg in sodium chloride 0.9 % 100 mL chemo infusion, 500 mg/m2 = 900 mg, Intravenous,  Once, 0 of 6 cycles CARBOplatin (PARAPLATIN) 470 mg in sodium chloride 0.9 % 250 mL chemo infusion, 470 mg (100 % of original dose 465.5 mg), Intravenous,  Once, 0 of 4 cycles Dose modification: 465.5 mg (original dose 465.5 mg, Cycle 1) fosaprepitant (EMEND) 150 mg in sodium chloride 0.9 % 145 mL IVPB, 150 mg, Intravenous,  Once, 0 of 4 cycles pembrolizumab (KEYTRUDA) 200 mg in sodium chloride 0.9 % 50 mL chemo infusion, 200 mg, Intravenous, Once, 0 of 6 cycles  for chemotherapy treatment.       REVIEW OF SYSTEMS:   Constitutional: Denies fevers, chills  Respiratory: Reports cough and discomfort to his left chest.  He reports shortness of breath. Cardiovascular: Denies palpitation, left chest discomfort Gastrointestinal:  Denies nausea,  heartburn or change in bowel habits Skin: Denies abnormal skin rashes Lymphatics: Denies new lymphadenopathy or easy bruising Neurological:Denies numbness, tingling or new weaknesses Behavioral/Psych: Mood is stable, no new changes  Extremities: No lower extremity edema All other systems were reviewed with the patient and are negative.  I have reviewed the past medical history, past surgical history, social history and family history with the patient and they are unchanged from previous note.   PHYSICAL EXAMINATION: ECOG PERFORMANCE STATUS: 1 - Symptomatic but completely ambulatory  Vitals:   04/22/19 0800 04/22/19 1000  BP: 119/74 121/83  Pulse: 96 (!) 103  Resp: (!) 22 (!) 22  Temp: 98.8 F (37.1 C)   SpO2: 91% 90%   Filed Weights   04/21/19 1156  Weight: 154 lb 5.2 oz (70 kg)    Intake/Output from previous day: 02/14 0701 - 02/15 0700 In: 480 [P.O.:480] Out: 1800   GENERAL:alert, appears short of breath SKIN: skin color, texture, turgor are normal, no rashes or significant lesions EYES: normal, Conjunctiva are pink and non-injected, sclera clear OROPHARYNX:no exudate, no erythema and lips, buccal mucosa, and tongue normal  NECK: supple, thyroid normal size, non-tender, without nodularity LYMPH:  no palpable lymphadenopathy in the cervical, axillary or inguinal LUNGS: Diminished breath sounds on the left HEART: regular rate & rhythm and no murmurs and no lower extremity edema ABDOMEN:abdomen soft, non-tender and normal bowel sounds Musculoskeletal:no cyanosis of digits and no clubbing  NEURO: alert & oriented x 3 with fluent speech, no focal  motor/sensory deficits  LABORATORY DATA:  I have reviewed the data as listed CMP Latest Ref Rng & Units 04/22/2019 04/21/2019 04/21/2019  Glucose 70 - 99 mg/dL 102(H) - 111(H)  BUN 6 - 20 mg/dL 24(H) - 18  Creatinine 0.61 - 1.24 mg/dL 1.15 - 1.00  Sodium 135 - 145 mmol/L 136 - 135  Potassium 3.5 - 5.1 mmol/L 4.1 - 3.6  Chloride  98 - 111 mmol/L 102 - 98  CO2 22 - 32 mmol/L 26 - 28  Calcium 8.9 - 10.3 mg/dL 8.2(L) - 8.5(L)  Total Protein 6.5 - 8.1 g/dL - 6.3(L) 6.3(L)  Total Bilirubin 0.3 - 1.2 mg/dL - - 0.6  Alkaline Phos 38 - 126 U/L - - 82  AST 15 - 41 U/L - - 22  ALT 0 - 44 U/L - - 48(H)    Lab Results  Component Value Date   WBC 11.6 (H) 04/22/2019   HGB 14.6 04/22/2019   HCT 45.5 04/22/2019   MCV 92.1 04/22/2019   PLT 326 04/22/2019   NEUTROABS 9.4 (H) 04/21/2019    DG Chest 2 View  Result Date: 04/05/2019 CLINICAL DATA:  History of a lung mass. EXAM: CHEST - 2 VIEW COMPARISON:  PA and lateral chest 03/07/2019.  CT chest 03/20/2019. FINDINGS: Left upper lobe mass lesion appears somewhat larger than on the prior plain film. There is a new small left pleural effusion. Lungs are emphysematous. Heart size is normal. No acute bony abnormality. 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 New small left pleural effusion. Emphysema. Electronically Signed   By: Inge Rise M.D.   On: 04/05/2019 11:52   CT Angio Chest PE W and/or Wo Contrast  Result Date: 04/21/2019 CLINICAL DATA:  Shortness of breath and near complete opacification of the left hemithorax. EXAM: CT ANGIOGRAPHY CHEST WITH CONTRAST TECHNIQUE: Multidetector CT imaging of the chest was performed using the standard protocol during bolus administration of intravenous contrast. Multiplanar CT image reconstructions and MIPs were obtained to evaluate the vascular anatomy. CONTRAST:  75mL OMNIPAQUE IOHEXOL 350 MG/ML SOLN COMPARISON:  Chest x-ray from earlier in the same day as well as a CT from 04/07/2019 FINDINGS: Cardiovascular: Thoracic aorta demonstrates a normal branching pattern without aneurysmal dilatation or dissection. No cardiac enlargement is seen. The pulmonary artery is well visualized with a normal branching pattern. Some attenuation of the left upper lobe  branches are seen related to the underlying disease process. No definitive filling defect to suggest pulmonary embolism is seen. Mediastinum/Nodes: Thoracic inlet is within normal limits. Scattered small mediastinal lymph nodes are again identified. More prominent soft tissue density is noted in the subcarinal region when compared with the prior exam consistent with adenopathy. This measures approximately 2 cm in dimension. The esophagus as visualized appears within normal limits. Lungs/Pleura: Large left-sided pleural effusion is seen. Considerable consolidation in the upper and lower lobes is noted. Density difference is noted in the left perihilar region consistent with underlying mass measuring approximately 5.7 x 5.5 cm. This has enlarged in size when compared with the prior exam at which time it measured 4.5 x 3.2 cm. No definitive pleural enhancement is seen. As described above there is splaying of the upper lobe pulmonary arterial branches identified. Upper Abdomen: Visualized upper abdomen is within normal limits. Musculoskeletal: Bony structures show mild compression deformity of T11 stable from the prior exam. Review of the MIP images confirms the above findings. IMPRESSION:  No evidence of pulmonary embolism. Significant increase in left-sided pleural effusion consistent with the underlying neoplasm. The central left perihilar lesion has increased in size as described above. Associated consolidation is noted as well. Prominent soft tissue density in the subcarinal region is noted consistent with lymphadenopathy. Electronically Signed   By: Inez Catalina M.D.   On: 04/21/2019 14:10   MR BRAIN W WO CONTRAST  Result Date: 04/11/2019 CLINICAL DATA:  Metastatic lung cancer. Head trauma with headache. Rule out metastatic disease. EXAM: MRI HEAD WITHOUT AND WITH CONTRAST TECHNIQUE: Multiplanar, multiecho pulse sequences of the brain and surrounding structures were obtained without and with intravenous  contrast. CONTRAST:  56mL GADAVIST GADOBUTROL 1 MMOL/ML IV SOLN COMPARISON:  None. FINDINGS: Brain: Ventricle size normal. Negative for infarct, hemorrhage, mass. No fluid collection or midline shift. Normal enhancement.  Negative for metastatic disease. Vascular: Normal arterial flow voids. Skull and upper cervical spine: No focal skeletal lesion. Sinuses/Orbits: Mucosal edema paranasal sinuses.  Normal orbit none Other: Mild motion degraded study. IMPRESSION: Negative MRI brain with contrast.  Negative for metastatic disease. Electronically Signed   By: Franchot Gallo M.D.   On: 04/11/2019 19:10   NM PET Image Initial (PI) Skull Base To Thigh  Result Date: 04/10/2019 CLINICAL DATA:  Initial treatment strategy for recently diagnosed left upper lobe non-small cell lung cancer. EXAM: NUCLEAR MEDICINE PET SKULL BASE TO THIGH TECHNIQUE: 7.7 mCi F-18 FDG was injected intravenously. Full-ring PET imaging was performed from the skull base to thigh after the radiotracer. CT data was obtained and used for attenuation correction and anatomic localization. Fasting blood glucose: 106 mg/dl COMPARISON:  03/20/2019 chest CT. FINDINGS: Mediastinal blood pool activity: SUV max 3.1 Liver activity: SUV max NA NECK: No enlarged or hypermetabolic lymph nodes in the neck. Curvilinear hypermetabolism in a right scalene muscle is probably activity related. Incidental CT findings: Mucous retention cyst versus polyp in the inferior right maxillary sinus. Nonhypermetabolic 2.7 cm right thyroid nodule. CHEST: Infiltrative hypermetabolic 8.2 x 5.1 cm central left upper lobe lung mass (series 8/image 32), contiguous with the left hilum, with max SUV 13.4. Additional hypermetabolic left upper lobe pulmonary nodules measuring 2.6 cm peripherally with max SUV 9.1 (series 8/image 26) and 1.7 cm inferiorly with max SUV 7.8 (series 8/image 39). Right lower lobe 0.4 cm solid pulmonary nodule is below PET resolution (series 8/image 31). Enlarged  hypermetabolic 1.3 cm subcarinal node with max SUV 10.1 (series 4/image 75). Left hilar nodal hypermetabolism with max SUV 8.1. Hypermetabolic 0.9 cm AP window node with max SUV 5.7 (series 4/image 70). Left paraspinal focus of hypermetabolism at the T12 level with max SUV 4.9, without discrete CT correlate. Incidental CT findings: Small dependent left pleural effusion. Moderate centrilobular emphysema. ABDOMEN/PELVIS: No abnormal hypermetabolic activity within the liver, pancreas, adrenal glands, or spleen. No hypermetabolic lymph nodes in the abdomen or pelvis. Incidental CT findings: none SKELETON: Hypermetabolic lytic 0.9 cm right iliac bone lesion with max SUV 7.9 (series 4/image 164). No additional skeletal foci of hypermetabolism. Incidental CT findings: none IMPRESSION: 1. Infiltrative hypermetabolic 8.2 cm central left upper lobe lung mass, contiguous with the left hilum, compatible with primary bronchogenic carcinoma. 2. Two additional hypermetabolic left upper lobe pulmonary nodules, compatible with pulmonary metastases to the same lobe. Right lower lobe 0.4 cm pulmonary nodule is below PET resolution and warrants attention on follow-up chest CT in 3 months. 3. Small dependent left pleural effusion. Nonspecific left T12 paraspinal focus of hypermetabolism without discrete CT correlate, cannot exclude  paraspinal/extreme medial basilar left pleural metastasis. 4. Hypermetabolic left hilar, subcarinal and AP window nodal metastases. 5. 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 6. PET-CT staging is Stage IVA (T4 N2 M1b). 7.  Emphysema (ICD10-J43.9). Electronically Signed   By: Ilona Sorrel M.D.   On: 04/10/2019 09:59   DG CHEST PORT 1 VIEW  Result Date: 04/22/2019 CLINICAL DATA:  Pleural effusion. EXAM: PORTABLE CHEST 1 VIEW COMPARISON:  Radiograph and CT yesterday FINDINGS: Large left pleural effusion with near complete opacification the left hemithorax. Minimal portion of the left  upper lobe is aerated. Mediastinal contours are obscured. Minimal patchy right lung base opacities likely atelectasis. No evidence of pulmonary edema. No pneumothorax. IMPRESSION: Large left pleural effusion with near complete opacification of the left hemithorax. Minimal portion of the left upper lobe aerated. Minimal right lung base atelectasis. Electronically Signed   By: Keith Rake M.D.   On: 04/22/2019 04:39   DG CHEST PORT 1 VIEW  Result Date: 04/21/2019 CLINICAL DATA:  Status post LEFT thoracentesis. EXAM: PORTABLE CHEST 1 VIEW COMPARISON:  Plain film from earlier same day. FINDINGS: Persistent near complete opacification of the LEFT hemithorax. RIGHT lung is clear. No pneumothorax seen. IMPRESSION: Persistent near complete opacification of the LEFT hemithorax. No pneumothorax seen. Electronically Signed   By: Franki Cabot M.D.   On: 04/21/2019 17:37   DG Chest Port 1 View  Result Date: 04/21/2019 CLINICAL DATA:  Pt with hx of lung ca, reports right rib pain/chest pain and cough. EXAM: PORTABLE CHEST 1 VIEW COMPARISON:  Chest radiograph 04/05/2019 FINDINGS: The visualized cardiomediastinal contours are stable. There is near complete opacification of the left hemithorax likely secondary to atelectasis and pleural effusion. Right lung is clear. No acute finding in the visualized skeleton. IMPRESSION: Near complete opacification of the left hemithorax likely secondary to atelectasis and pleural effusion. Electronically Signed   By: Audie Pinto M.D.   On: 04/21/2019 12:44    ASSESSMENT AND PLAN: This is a pleasant 59 year old male recently diagnosed with stage IVb (T4, N2, M1c) non-small cell lung cancer, likely lung primary based on imaging studies diagnosed in February 2021 with unclear subtype from pathology.  He presented with a large left upper lobe lung mass in addition to left hilar and mediastinal lymphadenopathy and metastatic nodules in the left upper lobe as well as right upper  lobe in addition to bone metastasis in the right iliac bone.  He is now admitted with worsening chest pain and hypoxia.  He was found to have a large left pleural effusion and is status post thoracentesis on 04/21/2019.  Repeat thoracentesis planned for today due to reaccumulation of fluid.  Cytology is currently pending.  I have called pathology and asked them to expedite pathology results and to call my cell phone with his information.  Discussed with pulmonology who is considering Pleurx catheter placement.  We would agree with Pleurx catheter placement for this patient for symptomatic relief.  The patient's molecular studies are currently pending.  We anticipate these results by the end of this week.  If he has any actionable mutations, we will determine the appropriate medication.  However, if he does not have any actual mutations, he will proceed with the planned course of chemotherapy with carboplatin for an AUC of 5, Alimta 500 mg meter squared, and Keytruda 200 mg IV every 3 weeks.  We anticipate starting this as an outpatient next week.   LOS: 1 day   Mikey Bussing, DNP, AGPCNP-BC,  AOCNP 04/22/19

## 2019-04-22 NOTE — Progress Notes (Signed)
Brief oncology note:  Received a call from Dr. Saralyn Pilar with pathology.  Preliminary cytology is malignant.  Cells are consistent with non-small cell cancer at this time, he favors adenocarcinoma.  However, additional immunohistochemical stains are pending.  Message sent to PCCM APP with these findings.  Mikey Bussing, DNP, AGPCNP-BC, AOCNP

## 2019-04-22 NOTE — Procedures (Signed)
Thoracentesis Procedure Note  Pre-operative Diagnosis: Recurrent left pleural effusion  Post-operative Diagnosis: same  Indications: Recurrent left pleural effusion  Procedure Details  Consent: Informed consent was obtained. Risks of the procedure were discussed including: infection, bleeding, pain, pneumothorax.  Under sterile conditions the patient was positioned. Betadine solution and sterile drapes were utilized.  1% buffered lidocaine was used to anesthetize the 6th rib space. Fluid was obtained without any difficulties and minimal blood loss.  A dressing was applied to the wound and wound care instructions were provided.   Findings 1000 ml of bloody pleural fluid was obtained. A sample was sent to Pathology for cytogenetics, flow, and cell counts, as well as for infection analysis.  Complications:  None; patient tolerated the procedure well.          Condition: stable  Plan A follow up chest x-ray was ordered. Unchanged opacification   Attending Attestation: I performed the procedure.  Rodman Pickle, M.D. [facility] Pulmonary/Critical Care Medicine 04/22/2019 11:50 AM

## 2019-04-22 NOTE — Progress Notes (Signed)
Received message from Dr. Wyline Copas to assist pt PleurX canister. Informed Dr. Wyline Copas that we will provide the pt with 10 canister and the PleurX [employer] form is at the nurses station or the form is in the package that is giving to the pt after the procedure. We will fax the form to PleurX after the procedure.

## 2019-04-22 NOTE — Progress Notes (Signed)
NAME:  George Mullen, MRN:  [id number], DOB:  1961/01/13, LOS: 1 ADMISSION DATE:  04/21/2019, CONSULTATION DATE:  04/21/2019 REFERRING MD:  Marye Round, MD, CHIEF COMPLAINT:  Left pleural effusion    Brief History   59 year old male with NSCLC admitted for acute hypoxemic respiratory failure secondary left pleural effusion  History of present illness   George Mullen is a 59 year old male former smoker with newly diagnosed metastatic non-small cell lung cancer with bone mets initially diagnosed on bronchoscopy 04/02/19. Two days ago, he began having sharp right-sided rib pain aggravated by cough and movement. Cough is non-productive and also began in the same time interval. Symptoms associated with shortness of breath. Denies recent fevers, chills, wheezing.  In the ED, vitals overall stable except for low-normal oxygen on 2L O2 and tachypnea. Patient appears uncomfortable with movement or deep breaths. Reports his chest pain is his worst symptom.  Past Medical History  Non-small cell lung cancer  Significant Hospital Events   2/14 Admitted  Consults:  PCCM  Procedures:  [place] 2/14 > 1.9L removed   Significant Diagnostic Tests:  CTA 04/21/19 - Large left pleural effusion, mediastinal and left hilar lymphadenopathy  Micro Data:    Antimicrobials:    Interim history/subjective:  Patient reports episode of coughing with movement overnight but reports improved dyspnea post thoracentesis 2/14    Objective   Blood pressure 119/74, pulse 96, temperature 99.2 F (37.3 C), temperature source Oral, resp. rate (!) 22, height 5\' 8"  (1.727 m), weight 70 kg, SpO2 91 %.        Intake/Output Summary (Last 24 hours) at 04/22/2019 0840 Last data filed at 04/22/2019 0403 Gross per 24 hour  Intake 480 ml  Output 1800 ml  Net -1320 ml   Filed Weights   04/21/19 1156  Weight: 70 kg    Examination: General: Chronically ill appearing adult male lying in bed, in NAD HEENT: [place]/AT, MM  pink/moist, PERRL,  Neuro: Alert and oriented x3, able to answer all questions and follow all commands  CV: s1s2 regular rate and rhythm, no murmur, rubs, or gallops,  PULM:  Very diminished breath sounds on left, no added breath sounds, no increased work of breathing, oxygen saturations 90-93% on 3.5L [place] GI: soft, bowel sounds active in all 4 quadrants, non-tender, non-distended Extremities: warm/dry, no edema  Skin: no rashes or lesions  Resolved Hospital Problem list     Assessment & Plan:  Acute hypoxemic respiratory failure secondary to left pleural effusion, presumed malignant in setting in non-small cell lung cancer -Consented patient for bedside thoracentesis. Discussed risk of recurrence as this is likely related to underlying cancer. If recurs, will consider PleurX catheter in the future. I extensively addressed patient and wife's questions. Communicated plan with ED physician. -Left thoracentesis completed 2/14 with 1.9L removed  P: Exudative by Lights criteria  Cytology pending  Follow intermittent CXR and cultures  Will likely need repeat thoracentesis today  Wean supplemental oxygen as able  Pain control per primary team    Non small cell lung cancer - Followed by Dr. Julien Nordmann as outpatient - Scheduled to start Pembrolizumab, pemetrexed, carboplatin. Has not started  Best practice:  Diet: Regular Pain/Anxiety/Delirium protocol (if indicated): Per primary VAP protocol (if indicated):  DVT prophylaxis: Hold until after thoracentesis GI prophylaxis: Per primary Glucose control: Per primary Mobility: As tolerated Code Status: Full Family Communication: Updated patient and wife at bedside Disposition: Admit to SDU   Labs   CBC:  Recent Labs  Lab 04/18/19 1430 04/21/19 1238 04/22/19 0308  WBC 11.5* 12.1* 11.6*  NEUTROABS 8.9* 9.4*  --   HGB 15.0 15.0 14.6  HCT 46.1 46.1 45.5  MCV 92.0 91.7 92.1  PLT 385 340 007    Basic Metabolic Panel: Recent Labs  Lab  04/18/19 1430 04/21/19 1238 04/22/19 0308  NA 142 135 136  K 4.0 3.6 4.1  CL 105 98 102  CO2 29 28 26   GLUCOSE 119* 111* 102*  BUN 22* 18 24*  CREATININE 1.20 1.00 1.15  CALCIUM 8.3* 8.5* 8.2*   GFR: Estimated Creatinine Clearance: 68.6 mL/min (by C-G formula based on SCr of 1.15 mg/dL). Recent Labs  Lab 04/18/19 1430 04/21/19 1238 04/22/19 0308  WBC 11.5* 12.1* 11.6*    Liver Function Tests: Recent Labs  Lab 04/18/19 1430 04/21/19 1238 04/21/19 1723  AST 13* 22  --   ALT 37 48*  --   ALKPHOS 85 82  --   BILITOT 0.3 0.6  --   PROT 6.3* 6.3* 6.3*  ALBUMIN 3.2* 3.2*  --    No results for input(s): LIPASE, AMYLASE in the last 168 hours. No results for input(s): AMMONIA in the last 168 hours.  ABG No results found for: PHART, PCO2ART, PO2ART, HCO3, TCO2, ACIDBASEDEF, O2SAT   Coagulation Profile: No results for input(s): INR, PROTIME in the last 168 hours.  Cardiac Enzymes: No results for input(s): CKTOTAL, CKMB, CKMBINDEX, TROPONINI in the last 168 hours.  HbA1C: No results found for: HGBA1C  CBG: No results for input(s): GLUCAP in the last 168 hours.   Signature:    Johnsie Cancel, NP-C [facility] Contact / Pager information can be found on Amion  04/22/2019, 8:56 AM

## 2019-04-23 ENCOUNTER — Inpatient Hospital Stay (HOSPITAL_COMMUNITY): Payer: Managed Care, Other (non HMO)

## 2019-04-23 ENCOUNTER — Encounter: Payer: Self-pay | Admitting: *Deleted

## 2019-04-23 ENCOUNTER — Telehealth: Payer: Self-pay | Admitting: Internal Medicine

## 2019-04-23 DIAGNOSIS — R079 Chest pain, unspecified: Secondary | ICD-10-CM

## 2019-04-23 DIAGNOSIS — Z87891 Personal history of nicotine dependence: Secondary | ICD-10-CM

## 2019-04-23 DIAGNOSIS — Z9689 Presence of other specified functional implants: Secondary | ICD-10-CM

## 2019-04-23 MED ORDER — ACETAMINOPHEN 500 MG PO TABS: 500.0000 mg | ORAL_TABLET | Freq: Three times a day (TID) | ORAL | 0 refills | Status: AC

## 2019-04-23 MED ORDER — KETOROLAC TROMETHAMINE 30 MG/ML IJ SOLN: 30.0000 mg | Freq: Four times a day (QID) | INTRAMUSCULAR | Status: DC

## 2019-04-23 MED ORDER — PREDNISONE 10 MG PO TABS: 10.0000 mg | ORAL_TABLET | Freq: Every day | ORAL | 0 refills | Status: DC

## 2019-04-23 MED ORDER — FENTANYL CITRATE (PF) 100 MCG/2ML IJ SOLN
INTRAMUSCULAR | Status: AC
  Administered 2019-04-23: 100 ug
  Filled 2019-04-23: qty 2

## 2019-04-23 MED ORDER — OXYCODONE HCL 5 MG PO TABS
10.0000 mg | ORAL_TABLET | ORAL | Status: DC | PRN
  Administered 2019-04-23 (×2): 10 mg via ORAL
  Filled 2019-04-23 (×2): qty 2

## 2019-04-23 MED ORDER — OXYCODONE HCL 10 MG PO TABS: 10.0000 mg | ORAL_TABLET | ORAL | 0 refills | Status: AC | PRN

## 2019-04-23 MED ORDER — PREDNISONE 10 MG PO TABS
10.0000 mg | ORAL_TABLET | Freq: Every day | ORAL | Status: DC
  Administered 2019-04-23: 10 mg via ORAL
  Filled 2019-04-23: qty 1

## 2019-04-23 MED ORDER — ACETAMINOPHEN 500 MG PO TABS
500.0000 mg | ORAL_TABLET | Freq: Three times a day (TID) | ORAL | Status: DC
  Administered 2019-04-23 (×2): 500 mg via ORAL
  Filled 2019-04-23 (×2): qty 1

## 2019-04-23 NOTE — Progress Notes (Addendum)
   NAME:  George Mullen, MRN:  [id number], DOB:  06-29-1960, LOS: 2 ADMISSION DATE:  04/21/2019, CONSULTATION DATE:  04/21/2019 REFERRING MD:  Marye Round, MD, CHIEF COMPLAINT:  Left pleural effusion    Brief History   59 year old male with NSCLC admitted for acute hypoxemic respiratory failure secondary left pleural effusion  History of present illness   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 Two days ago, he began having sharp right-sided rib pain aggravated by cough and movement. Cough is non-productive and also began in the same time interval. Symptoms associated with shortness of breath. Denies recent fevers, chills, wheezing.  In the ED, vitals overall stable except for low-normal oxygen on 2L O2 and tachypnea. Patient appears uncomfortable with movement or deep breaths. Reports his chest pain is his worst symptom.  Past Medical History  Non-small cell lung cancer  Significant Hospital Events   2/14 Admitted  Consults:  PCCM  Procedures:  Thora 2/14 > 1.9L removed   Significant Diagnostic Tests:  CTA 04/21/19 - Large left pleural effusion, mediastinal and left hilar lymphadenopathy  Micro Data:    Antimicrobials:    Interim history/subjective:  Still coughing and feels pressure building in L chest again.  Objective   Blood pressure 100/64, pulse (!) 104, temperature 99.3 F (37.4 C), temperature source Oral, resp. rate (!) 22, height 5\' 8"  (1.727 m), weight 70 kg, SpO2 91 %.        Intake/Output Summary (Last 24 hours) at 04/23/2019 0747 Last data filed at 04/22/2019 1800 Gross per 24 hour  Intake 1440 ml  Output 1575 ml  Net -135 ml   Filed Weights   04/21/19 1156  Weight: 70 kg    Examination: GEN: no acute distress, coughing fits on deep breathing HEENT: MMM, no thrush CV: tachycardic, ext warm PULM: diminished on L, moderate  effusion and elevated diaphragm on [place] GI: Soft, +BS EXT: No edema NEURO: Moves all 4 ext to command PSYCH: RASS 0 SKIN: erythema along right chest wall  CXR pending HgB stable   Resolved Hospital Problem list     Assessment & Plan:  Malignant effusion- underlying very poorly differentiated aggressive NSCLC.  Likely now having complete left bronchial obstruction causing entrapment physiology. - PleurX this AM - qday drainage up to 1L or pain whichever occurs first - supply patient with 10 bottles, will send in script for rest - train family on draining and dressing today - for home, would do standing tylenol 500mg  TID, motrin 600mg  with meals PRN, oxycodone 10mg  q4h PRN breakthrough pain, and per primary's discretion for cough, nothing really works well - Encourage mobility - Close oncology and pulm f/u, he will come in 1 week to clinic to get pleurX sutures removed and have site check - Okay for home if patient and wife are comfortable with it, probably will need home O2 evaluation  Erskine Emery MD PCCM

## 2019-04-23 NOTE — Telephone Encounter (Signed)
Scheduled per los. Called and spoke with patients wife. Wife will call back when she gets to the hospital with patient to confirm the appts

## 2019-04-23 NOTE — TOC Transition Note (Addendum)
Transition of Care [facility]) - CM/SW Discharge Note   Patient Details  Name: George Mullen MRN: [id number] Date of Birth: 10/03/1960  [facility] CM/SW Contact:  Leeroy Cha, RN Phone Number: 04/23/2019, 4:07 PM   Clinical Narrative:    Home o2 through [employer] in [place].  Travel tank will be through [employer]. Information faxed to the [place] office at [phone] successfully.. Pleurx information faxed to md office. Wife trained on emptying the cath.   Final next level of care: Home/Self Care Barriers to Discharge: No Barriers Identified   Patient Goals and CMS Choice Patient states their goals for this hospitalization and ongoing recovery are:: to go home CMS Medicare.gov Compare Post Acute Care list provided to:: Patient Choice offered to / list presented to : Patient  Discharge Placement                       Discharge Plan and Services   Discharge Planning Services: CM Consult Post Acute Care Choice: Durable Medical Equipment          DME Arranged: Oxygen DME Agency: [employer] Date DME Agency Contacted: 04/23/19 Time DME Agency Contacted: [phone] Representative spoke with at DME Agency: [email or web address]            Social Determinants of Health ([name]) Interventions     Readmission Risk Interventions No flowsheet data found.

## 2019-04-23 NOTE — Telephone Encounter (Signed)
George Furbish, MD  P [facility] Triage Pool  With Desai/Icard/Smith/Clark/Ellison for pleurX site check and suture removal.   Thanks  --------------------------------------------------------- Pt is still currently admitted.

## 2019-04-23 NOTE — Progress Notes (Signed)
Oncology Nurse Navigator Documentation  Oncology Nurse Navigator Flowsheets 04/23/2019  Abnormal Finding Date -  Confirmed Diagnosis Date -  Diagnosis Status -  Navigator Location [facility]  Referral Date to RadOnc/MedOnc -  Navigator Encounter Type Other/I followed up with NP regarding patient and his hospitalization.  Patient has malignant plural effusion. I will update Dr. Julien Nordmann.   Telephone -  [facility] Date -  Multidisiplinary Clinic Type -  Patient Visit Type -  Treatment Phase Pre-Tx/Tx Discussion  Barriers/Navigation Needs Coordination of Care  Education -  Interventions Coordination of Care  Acuity Level 2-Minimal Needs (1-2 Barriers Identified)  Coordination of Care Other  Education Method -  Time Spent with Patient 30

## 2019-04-23 NOTE — Progress Notes (Signed)
    Durable Medical Equipment  (From admission, onward)         Start     Ordered   04/23/19 1604  For home use only DME oxygen  Once    Question Answer Comment  Length of Need Lifetime   Mode or (Route) Nasal cannula   Liters per Minute 3   Frequency Continuous (stationary and portable oxygen unit needed)   Oxygen conserving device Yes   Oxygen delivery system Gas      04/23/19 1603

## 2019-04-23 NOTE — Discharge Summary (Signed)
Physician Discharge Summary  George Mullen XNA:[id number] DOB: 03-Jul-1960 DOA: 04/21/2019  PCP: Glenda Chroman, MD  Admit date: 04/21/2019 Discharge date: 04/23/2019  Admitted From: Home Disposition:  home  Recommendations for Outpatient Follow-up:  1. Follow up with PCP in 1-2 weeks  Equipment/Devices:Home O2    Discharge Condition:Improved CODE STATUS:Full Diet recommendation: Regular   Brief/Interim Summary: 59 y.o.malewith medical history significant ofLung Cancer, recent diagnosis, Stage IVB who comes in with worsening right sided chest pain. Pain has been present since 2 days ago. It is worse with coughing. On cough meds, which are not helping. He denies worsening SOB. Pain feels sharp and is in rib area. Does radiate. Has tried rib belt, which was not helpful. To begin palliative chemo. CXR shows worsening left effusion.  ED Course:Pulmonary consulted, will perform bedside thoracentesis. Patient has new O2 requirement  Discharge Diagnoses:  Active Problems:   Former smoker   Non-small cell cancer of left lung ([place])   Right-sided chest pain   Malignant pleural effusion   Hypoxia   Acute hypoxemic respiratory failure -Pt is s/p thoracentesis 2/14 per PCCM with rapid re-accumilation noted on 2/15, CXR reviewed -Fluid analysis consistent with malignant effusion, non-small cell cancer, favoring adenocarcinoma -Oncology following.  -Patient had remained on 3.5LNC, will arrange home O2 -Pt now s/p Pleurix placement 2/16  Malignant Pleural Effusion - plan as above.  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 - worsening size of tumor, pt noted to be planned for palliative chemo starting next week -Oncology following  Former Smoker - continued on Nicoderm patch 14mg  daily  Leukocytosis -Unclear etiology, improving    Tachycardia - Likely related to large effusion - Improved and normalized following removal of pleural fluid  Discharge Instructions   Allergies as of 04/23/2019   No Known Allergies     Medication List    STOP taking these medications   diazepam 2 MG tablet Commonly known as: Valium     TAKE these medications   acetaminophen 500 MG tablet Commonly known as: TYLENOL Take 1 tablet (500 mg total) by mouth 3 (three) times daily. What changed:   when to take this  reasons to take this   albuterol (2.5 MG/3ML) 0.083% nebulizer solution Commonly known as: PROVENTIL Take 3 mLs (2.5 mg total) by nebulization every 6 (six) hours as needed for wheezing or shortness of breath.   benzonatate 200 MG capsule Commonly known as: TESSALON Take 1 capsule (200 mg total) by mouth 3 (three) times daily as needed for cough.   Breo Ellipta 100-25 MCG/INH Aepb Generic drug: fluticasone furoate-vilanterol Inhale 1 puff into the lungs daily.   docusate sodium 100 MG capsule Commonly known as: COLACE Take 100 mg by mouth 2 (two) times daily.   folic acid 1 MG tablet Commonly known as: FOLVITE Take 1 tablet (1 mg total) by mouth daily.   HYDROcodone-homatropine 5-1.5 MG/5ML syrup Commonly known as: HYCODAN Take 5 mLs by mouth every 6 (six) hours as needed for cough. What changed: when to take this   ibuprofen 200 MG tablet Commonly known as: ADVIL Take 600 mg by mouth every 6 (six) hours as needed for fever, headache or moderate pain.   multivitamin with minerals Tabs tablet Take 1 tablet by mouth daily.   nicotine 21 mg/24hr patch Commonly known as: NICODERM CQ - dosed in mg/24 hours Place 21  mg onto the skin daily.   ondansetron 4 MG disintegrating tablet Commonly known as: Zofran ODT Take 1 tablet (4 mg total) by mouth every 8 (eight) hours as needed for nausea or vomiting.   Oxycodone HCl 10 MG Tabs Take 1 tablet (10 mg total) by mouth every 4 (four) hours as needed  for moderate pain.   oxyCODONE-acetaminophen 5-325 MG tablet Commonly known as: PERCOCET/ROXICET Take 1 tablet by mouth every 6 (six) hours as needed for severe pain.   predniSONE 10 MG tablet Commonly known as: DELTASONE Take 1 tablet (10 mg total) by mouth daily with breakfast for 7 days. Start taking on: April 24, 2019 What changed:   medication strength  how much to take   prochlorperazine 10 MG tablet Commonly known as: COMPAZINE Take 1 tablet (10 mg total) by mouth every 6 (six) hours as needed for nausea or vomiting.            Durable Medical Equipment  (From admission, onward)         Start     Ordered   04/23/19 1604  For home use only DME oxygen  Once    Question Answer Comment  Length of Need Lifetime   Mode or (Route) Nasal cannula   Liters per Minute 3   Frequency Continuous (stationary and portable oxygen unit needed)   Oxygen conserving device Yes   Oxygen delivery system Gas      04/23/19 1603         Follow-up Information    Vyas, Dhruv B, MD. Schedule an appointment as soon as possible for a visit in 1 week(s).   Specialty: Internal Medicine Contact information: [place] [address] [phone]          No Known Allergies  Consultations:  PCCM  Oncology  Procedures/Studies: DG Chest 1 View  Result Date: 04/23/2019 CLINICAL DATA:  Chest tube EXAM: CHEST  1 VIEW COMPARISON:  04/23/2019 FINDINGS: Left chest tube in place. Continued complete opacification of the left hemithorax. Patchy opacity in the right lower lung. Heart likely normal size. No acute bony abnormality. IMPRESSION: Interval placement of left chest tube with continued complete opacification of the left hemithorax. Right basilar opacity concerning for pneumonia. Electronically Signed   By: Rolm Baptise M.D.   On: 04/23/2019 08:55   DG Chest 2 View  Result Date: 04/05/2019 CLINICAL DATA:  History of a lung mass. EXAM: CHEST - 2 VIEW COMPARISON:  PA and  lateral chest 03/07/2019.  CT chest 03/20/2019. FINDINGS: Left upper lobe mass lesion appears somewhat larger than on the prior plain film. There is a new small left pleural effusion. Lungs are emphysematous. Heart size is normal. No acute bony abnormality. 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 New small left pleural effusion. Emphysema. Electronically Signed   By: Inge Rise M.D.   On: 04/05/2019 11:52   CT Angio Chest PE W and/or Wo Contrast  Result Date: 04/21/2019 CLINICAL DATA:  Shortness of breath and near complete opacification of the left hemithorax. EXAM: CT ANGIOGRAPHY CHEST WITH CONTRAST TECHNIQUE: Multidetector CT imaging of the chest was performed using the standard protocol during bolus administration of intravenous contrast. Multiplanar CT image reconstructions and MIPs were obtained to evaluate the vascular anatomy. CONTRAST:  17mL OMNIPAQUE IOHEXOL 350 MG/ML SOLN COMPARISON:  Chest x-ray from earlier in the same day as well as a CT from 04/07/2019 FINDINGS:  Cardiovascular: Thoracic aorta demonstrates a normal branching pattern without aneurysmal dilatation or dissection. No cardiac enlargement is seen. The pulmonary artery is well visualized with a normal branching pattern. Some attenuation of the left upper lobe branches are seen related to the underlying disease process. No definitive filling defect to suggest pulmonary embolism is seen. Mediastinum/Nodes: Thoracic inlet is within normal limits. Scattered small mediastinal lymph nodes are again identified. More prominent soft tissue density is noted in the subcarinal region when compared with the prior exam consistent with adenopathy. This measures approximately 2 cm in dimension. The esophagus as visualized appears within normal limits. Lungs/Pleura: Large left-sided pleural effusion is seen. Considerable consolidation in the upper and lower lobes  is noted. Density difference is noted in the left perihilar region consistent with underlying mass measuring approximately 5.7 x 5.5 cm. This has enlarged in size when compared with the prior exam at which time it measured 4.5 x 3.2 cm. No definitive pleural enhancement is seen. As described above there is splaying of the upper lobe pulmonary arterial branches identified. Upper Abdomen: Visualized upper abdomen is within normal limits. Musculoskeletal: Bony structures show mild compression deformity of T11 stable from the prior exam. Review of the MIP images confirms the above findings. IMPRESSION: No evidence of pulmonary embolism. Significant increase in left-sided pleural effusion consistent with the underlying neoplasm. The central left perihilar lesion has increased in size as described above. Associated consolidation is noted as well. Prominent soft tissue density in the subcarinal region is noted consistent with lymphadenopathy. Electronically Signed   By: Inez Catalina M.D.   On: 04/21/2019 14:10   MR BRAIN W WO CONTRAST  Result Date: 04/11/2019 CLINICAL DATA:  Metastatic lung cancer. Head trauma with headache. Rule out metastatic disease. EXAM: MRI HEAD WITHOUT AND WITH CONTRAST TECHNIQUE: Multiplanar, multiecho pulse sequences of the brain and surrounding structures were obtained without and with intravenous contrast. CONTRAST:  51mL GADAVIST GADOBUTROL 1 MMOL/ML IV SOLN COMPARISON:  None. FINDINGS: Brain: Ventricle size normal. Negative for infarct, hemorrhage, mass. No fluid collection or midline shift. Normal enhancement.  Negative for metastatic disease. Vascular: Normal arterial flow voids. Skull and upper cervical spine: No focal skeletal lesion. Sinuses/Orbits: Mucosal edema paranasal sinuses.  Normal orbit none Other: Mild motion degraded study. IMPRESSION: Negative MRI brain with contrast.  Negative for metastatic disease. Electronically Signed   By: Franchot Gallo M.D.   On: 04/11/2019 19:10    NM PET Image Initial (PI) Skull Base To Thigh  Result Date: 04/10/2019 CLINICAL DATA:  Initial treatment strategy for recently diagnosed left upper lobe non-small cell lung cancer. EXAM: NUCLEAR MEDICINE PET SKULL BASE TO THIGH TECHNIQUE: 7.7 mCi F-18 FDG was injected intravenously. Full-ring PET imaging was performed from the skull base to thigh after the radiotracer. CT data was obtained and used for attenuation correction and anatomic localization. Fasting blood glucose: 106 mg/dl COMPARISON:  03/20/2019 chest CT. FINDINGS: Mediastinal blood pool activity: SUV max 3.1 Liver activity: SUV max NA NECK: No enlarged or hypermetabolic lymph nodes in the neck. Curvilinear hypermetabolism in a right scalene muscle is probably activity related. Incidental CT findings: Mucous retention cyst versus polyp in the inferior right maxillary sinus. Nonhypermetabolic 2.7 cm right thyroid nodule. CHEST: Infiltrative hypermetabolic 8.2 x 5.1 cm central left upper lobe lung mass (series 8/image 32), contiguous with the left hilum, with max SUV 13.4. Additional hypermetabolic left upper lobe pulmonary nodules measuring 2.6 cm peripherally with max SUV 9.1 (series 8/image 26) and 1.7 cm inferiorly with  max SUV 7.8 (series 8/image 39). Right lower lobe 0.4 cm solid pulmonary nodule is below PET resolution (series 8/image 31). Enlarged hypermetabolic 1.3 cm subcarinal node with max SUV 10.1 (series 4/image 75). Left hilar nodal hypermetabolism with max SUV 8.1. Hypermetabolic 0.9 cm AP window node with max SUV 5.7 (series 4/image 70). Left paraspinal focus of hypermetabolism at the T12 level with max SUV 4.9, without discrete CT correlate. Incidental CT findings: Small dependent left pleural effusion. Moderate centrilobular emphysema. ABDOMEN/PELVIS: No abnormal hypermetabolic activity within the liver, pancreas, adrenal glands, or spleen. No hypermetabolic lymph nodes in the abdomen or pelvis. Incidental CT findings: none  SKELETON: Hypermetabolic lytic 0.9 cm right iliac bone lesion with max SUV 7.9 (series 4/image 164). No additional skeletal foci of hypermetabolism. Incidental CT findings: none IMPRESSION: 1. Infiltrative hypermetabolic 8.2 cm central left upper lobe lung mass, contiguous with the left hilum, compatible with primary bronchogenic carcinoma. 2. Two additional hypermetabolic left upper lobe pulmonary nodules, compatible with pulmonary metastases to the same lobe. Right lower lobe 0.4 cm pulmonary nodule is below PET resolution and warrants attention on follow-up chest CT in 3 months. 3. Small dependent left pleural effusion. Nonspecific left T12 paraspinal focus of hypermetabolism without discrete CT correlate, cannot exclude paraspinal/extreme medial basilar left pleural metastasis. 4. Hypermetabolic left hilar, subcarinal and AP window nodal metastases. 5. 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 6. PET-CT staging is Stage IVA (T4 N2 M1b). 7.  Emphysema (ICD10-J43.9). Electronically Signed   By: Ilona Sorrel M.D.   On: 04/10/2019 09:59   DG CHEST PORT 1 VIEW  Result Date: 04/23/2019 CLINICAL DATA:  Pleural effusion. EXAM: PORTABLE CHEST 1 VIEW COMPARISON:  Radiograph yesterday. CT 04/21/2019 FINDINGS: Complete opacification of the left hemithorax with decreased aeration of the upper lobe since yesterday. Suggestion of increasing effusion with slight rightward tracheal deviation and increased conspicuity of the right heart border. Mild atelectasis at the right lung base. No visualized pneumothorax. IMPRESSION: Complete opacification of the left hemithorax with decreased aeration of the upper lobe since yesterday. Suggestion of increasing effusion with rightward tracheal deviation and increased conspicuity of the right heart border. Electronically Signed   By: Keith Rake M.D.   On: 04/23/2019 05:43   DG CHEST PORT 1 VIEW  Result Date: 04/22/2019 CLINICAL DATA:  Status post  thoracentesis EXAM: PORTABLE CHEST 1 VIEW COMPARISON:  04/22/2019 FINDINGS: Cardiac shadow is obscured by the large left pleural effusion. Only minimal aeration of the left upper lobe is noted. Continued opacification of the left hemithorax is seen consistent with the large effusion and underlying mass lesion. Right lung demonstrates mild right basilar atelectasis. No pneumothorax is seen. IMPRESSION: Stable appearance of the chest. Electronically Signed   By: Inez Catalina M.D.   On: 04/22/2019 11:42   DG CHEST PORT 1 VIEW  Result Date: 04/22/2019 CLINICAL DATA:  Pleural effusion. EXAM: PORTABLE CHEST 1 VIEW COMPARISON:  Radiograph and CT yesterday FINDINGS: Large left pleural effusion with near complete opacification the left hemithorax. Minimal portion of the left upper lobe is aerated. Mediastinal contours are obscured. Minimal patchy right lung base opacities likely atelectasis. No evidence of pulmonary edema. No pneumothorax. IMPRESSION: Large left pleural effusion with near complete opacification of the left hemithorax. Minimal portion of the left upper lobe aerated. Minimal right lung base atelectasis. Electronically Signed   By: Keith Rake M.D.   On: 04/22/2019 04:39   DG CHEST PORT 1 VIEW  Result Date: 04/21/2019 CLINICAL DATA:  Status post LEFT thoracentesis. EXAM: PORTABLE CHEST 1 VIEW COMPARISON:  Plain film from earlier same day. FINDINGS: Persistent near complete opacification of the LEFT hemithorax. RIGHT lung is clear. No pneumothorax seen. IMPRESSION: Persistent near complete opacification of the LEFT hemithorax. No pneumothorax seen. Electronically Signed   By: Franki Cabot M.D.   On: 04/21/2019 17:37   DG Chest Port 1 View  Result Date: 04/21/2019 CLINICAL DATA:  Pt with hx of lung ca, reports right rib pain/chest pain and cough. EXAM: PORTABLE CHEST 1 VIEW COMPARISON:  Chest radiograph 04/05/2019 FINDINGS: The visualized cardiomediastinal contours are stable. There is near  complete opacification of the left hemithorax likely secondary to atelectasis and pleural effusion. Right lung is clear. No acute finding in the visualized skeleton. IMPRESSION: Near complete opacification of the left hemithorax likely secondary to atelectasis and pleural effusion. Electronically Signed   By: Audie Pinto M.D.   On: 04/21/2019 12:44     Subjective: Eager to go home  Discharge Exam: Vitals:   04/23/19 1000 04/23/19 1200  BP: 124/66   Pulse: 85   Resp: 17   Temp:  97.9 F (36.6 C)  SpO2: 92%    Vitals:   04/23/19 0800 04/23/19 0900 04/23/19 1000 04/23/19 1200  BP: 125/78 132/76 124/66   Pulse: 94 89 85   Resp: (!) 22 (!) 21 17   Temp:    97.9 F (36.6 C)  TempSrc:    Oral  SpO2: 90% 92% 92%   Weight:      Height:        General: Pt is alert, awake, not in acute distress Cardiovascular: RRR, S1/S2 +, no rubs, no gallops Respiratory: CTA bilaterally, no wheezing, no rhonchi Abdominal: Soft, NT, ND, bowel sounds + Extremities: no edema, no cyanosis   The results of significant diagnostics from this hospitalization (including imaging, microbiology, ancillary and laboratory) are listed below for reference.     Microbiology: Recent Results (from the past 240 hour(s))  Respiratory Panel by RT PCR (Flu A&B, Covid) - Nasopharyngeal Swab     Status: None   Collection Time: 04/21/19  2:57 PM   Specimen: Nasopharyngeal Swab  Result Value Ref Range Status   SARS Coronavirus 2 by RT PCR NEGATIVE NEGATIVE Final    Comment: (NOTE) SARS-CoV-2 target nucleic acids are NOT DETECTED. The SARS-CoV-2 RNA is generally detectable in upper respiratoy specimens during the acute phase of infection. The lowest concentration of SARS-CoV-2 viral copies this assay can detect is 131 copies/mL. A negative result does not preclude SARS-Cov-2 infection and should not be used as the sole basis for treatment or other patient management decisions. A negative result may occur with   improper specimen collection/handling, submission of specimen other than nasopharyngeal swab, presence of viral mutation(s) within the areas targeted by this assay, and inadequate number of viral copies (<131 copies/mL). A negative result must be combined with clinical observations, patient history, and epidemiological information. The expected result is Negative. Fact Sheet for Patients:  PinkCheek.be Fact Sheet for Healthcare Providers:  GravelBags.it This test is not yet ap proved or cleared by the [place] FDA and  has been authorized for detection and/or diagnosis of SARS-CoV-2 by FDA under an Emergency Use Authorization (EUA). This EUA will remain  in effect (meaning this test can be used) for the duration of the COVID-19 declaration under Section 564(b)(1) of the Act, 21 U.S.C. section 360bbb-3(b)(1), unless the authorization is terminated or revoked sooner.    Influenza A by  PCR NEGATIVE NEGATIVE Final   Influenza B by PCR NEGATIVE NEGATIVE Final    Comment: (NOTE) The Xpert Xpress SARS-CoV-2/FLU/RSV assay is intended as an aid in  the diagnosis of influenza from Nasopharyngeal swab specimens and  should not be used as a sole basis for treatment. Nasal washings and  aspirates are unacceptable for Xpert Xpress SARS-CoV-2/FLU/RSV  testing. Fact Sheet for Patients: PinkCheek.be Fact Sheet for Healthcare Providers: GravelBags.it This test is not yet approved or cleared by the [place] FDA and  has been authorized for detection and/or diagnosis of SARS-CoV-2 by  FDA under an Emergency Use Authorization (EUA). This EUA will remain  in effect (meaning this test can be used) for the duration of the  Covid-19 declaration under Section 564(b)(1) of the Act, 21  U.S.C. section 360bbb-3(b)(1), unless the authorization is  terminated or revoked. Performed at  [facility], [place] [address], [place], [place] [address]   Body fluid culture (includes gram stain)     Status: None (Preliminary result)   Collection Time: 04/21/19  5:08 PM   Specimen: Pleural Fluid  Result Value Ref Range Status   Specimen Description   Final    PLEURAL Performed at [facility] [address], [place], [place] [address]    Special Requests   Final    Immunocompromised Performed at [facility], [place] [address], [place], [place] [address]    Gram Stain   Final    RARE WBC PRESENT, PREDOMINANTLY MONONUCLEAR NO ORGANISMS SEEN    Culture   Final    NO GROWTH 2 DAYS Performed at [facility], [place] [address], [place],  [address]    Report Status PENDING  Incomplete     Labs: BNP (last 3 results) No results for input(s): BNP in the last 8760 hours. Basic Metabolic Panel: Recent Labs  Lab 04/18/19 1430 04/21/19 1238 04/22/19 0308  NA 142 135 136  K 4.0 3.6 4.1  CL 105 98 102  CO2 29 28 26   GLUCOSE 119* 111* 102*  BUN 22* 18 24*  CREATININE 1.20 1.00 1.15  CALCIUM 8.3* 8.5* 8.2*   Liver Function Tests: Recent Labs  Lab 04/18/19 1430 04/21/19 1238 04/21/19 1723  AST 13* 22  --   ALT 37 48*  --   ALKPHOS 85 82  --   BILITOT 0.3 0.6  --   PROT 6.3* 6.3* 6.3*  ALBUMIN 3.2* 3.2*  --    No results for input(s): LIPASE, AMYLASE in the last 168 hours. No results for input(s): AMMONIA in the last 168 hours. CBC: Recent Labs  Lab 04/18/19 1430 04/21/19 1238 04/22/19 0308 04/22/19 1517  WBC 11.5* 12.1* 11.6*  --   NEUTROABS 8.9* 9.4*  --   --   HGB 15.0 15.0 14.6 15.4  HCT 46.1 46.1 45.5 47.3  MCV 92.0 91.7 92.1  --   PLT 385 340 326  --    Cardiac Enzymes: No results for input(s): CKTOTAL, CKMB, CKMBINDEX, TROPONINI in the last 168 hours. BNP: Invalid input(s): POCBNP CBG: No results for input(s): GLUCAP in the last 168 hours. D-Dimer No results for input(s): DDIMER  in the last 72 hours. Hgb A1c No results for input(s): HGBA1C in the last 72 hours. Lipid Profile No results for input(s): CHOL, HDL, LDLCALC, TRIG, CHOLHDL, LDLDIRECT in the last 72 hours. Thyroid function studies No results for input(s): TSH, T4TOTAL, T3FREE, THYROIDAB in the last 72 hours.  Invalid input(s):  FREET3 Anemia work up No results for input(s): VITAMINB12, FOLATE, FERRITIN, TIBC, IRON, RETICCTPCT in the last 72 hours. Urinalysis No results found for: COLORURINE, APPEARANCEUR, [place], [place], [place], [place], [place], [place], PROTEINUR, UROBILINOGEN, NITRITE, LEUKOCYTESUR Sepsis Labs Invalid input(s): PROCALCITONIN,  WBC,  LACTICIDVEN Microbiology Recent Results (from the past 240 hour(s))  Respiratory Panel by RT PCR (Flu A&B, Covid) - Nasopharyngeal Swab     Status: None   Collection Time: 04/21/19  2:57 PM   Specimen: Nasopharyngeal Swab  Result Value Ref Range Status   SARS Coronavirus 2 by RT PCR NEGATIVE NEGATIVE Final    Comment: (NOTE) SARS-CoV-2 target nucleic acids are NOT DETECTED. The SARS-CoV-2 RNA is generally detectable in upper respiratoy specimens during the acute phase of infection. The lowest concentration of SARS-CoV-2 viral copies this assay can detect is 131 copies/mL. A negative result does not preclude SARS-Cov-2 infection and should not be used as the sole basis for treatment or other patient management decisions. A negative result may occur with  improper specimen collection/handling, submission of specimen other than nasopharyngeal swab, presence of viral mutation(s) within the areas targeted by this assay, and inadequate number of viral copies (<131 copies/mL). A negative result must be combined with clinical observations, patient history, and epidemiological information. The expected result is Negative. Fact Sheet for Patients:  PinkCheek.be Fact Sheet for Healthcare Providers:   GravelBags.it This test is not yet ap proved or cleared by the [place] FDA and  has been authorized for detection and/or diagnosis of SARS-CoV-2 by FDA under an Emergency Use Authorization (EUA). This EUA will remain  in effect (meaning this test can be used) for the duration of the COVID-19 declaration under Section 564(b)(1) of the Act, 21 U.S.C. section 360bbb-3(b)(1), unless the authorization is terminated or revoked sooner.    Influenza A by PCR NEGATIVE NEGATIVE Final   Influenza B by PCR NEGATIVE NEGATIVE Final    Comment: (NOTE) The Xpert Xpress SARS-CoV-2/FLU/RSV assay is intended as an aid in  the diagnosis of influenza from Nasopharyngeal swab specimens and  should not be used as a sole basis for treatment. Nasal washings and  aspirates are unacceptable for Xpert Xpress SARS-CoV-2/FLU/RSV  testing. Fact Sheet for Patients: PinkCheek.be Fact Sheet for Healthcare Providers: GravelBags.it This test is not yet approved or cleared by the [place] FDA and  has been authorized for detection and/or diagnosis of SARS-CoV-2 by  FDA under an Emergency Use Authorization (EUA). This EUA will remain  in effect (meaning this test can be used) for the duration of the  Covid-19 declaration under Section 564(b)(1) of the Act, 21  U.S.C. section 360bbb-3(b)(1), unless the authorization is  terminated or revoked. Performed at [facility], [place] [address], [place], [place] [address]   Body fluid culture (includes gram stain)     Status: None (Preliminary result)   Collection Time: 04/21/19  5:08 PM   Specimen: Pleural Fluid  Result Value Ref Range Status   Specimen Description   Final    PLEURAL Performed at [facility] [address] [place], [place] [address]    Special Requests   Final    Immunocompromised Performed at [facility], [place] [address], [place],  [address]    Gram Stain   Final    RARE WBC PRESENT, PREDOMINANTLY MONONUCLEAR NO ORGANISMS SEEN    Culture   Final    NO GROWTH 2 DAYS Performed at [facility], [place]  [address], [place], [place] [address]    Report Status PENDING  Incomplete   Time spent: 30 min  SIGNED:   Marylu Lund, MD  [facility] 04/23/2019, 4:26 PM  If 7PM-7AM, please contact night-coverage

## 2019-04-23 NOTE — Progress Notes (Signed)
SATURATION QUALIFICATIONS: (This note is used to comply with regulatory documentation for home oxygen)  Patient Saturations on Room Air at Rest = 86%  Patient Saturations on Room Air while Ambulating = 86%  Patient Saturations on 3 Liters of oxygen while Ambulating = 92%  Please briefly explain why patient needs home oxygen:

## 2019-04-23 NOTE — Progress Notes (Addendum)
TOC CM received call from Unit RN, states home concentrator was not delivered. Contacted number provided by Unit RN, [place] [place] rep # [phone]. Spoke to [place], states orders, qualifying sats, face sheet and dc summary note were not received. Faxed orders and rep will give CM a call back once processed. Pt desires to go home today. Unit RN states they have a portable in room waiting. [place], [place] ED TOC CM [phone]

## 2019-04-23 NOTE — Progress Notes (Signed)
CSW received a call from pt's RN that the pt has received the portable oxygen tank needed for the pt's ride home to [place], where the pt lives, but that the coordination of the pt's oxygen tank being delivered to the pt's home was not confirmed.    Pt's RN stated she was already getting assistance from the [facility] ED RN CM but that she was calling the CSW to get the # for the [facility] ED RN CM so she could call the RN CM back if needed.  Per the pt's RN, pt stated pt was intent on D/C'ing tonight if possible.  CSW will continue to follow for D/C needs.  Alphonse Guild. Egan Berkheimer, LCSW, LCAS, CSI Transitions of Care Clinical Social Worker Care Coordination Department Ph: [phone]

## 2019-04-23 NOTE — Progress Notes (Signed)
Pt home O2 delivered to his house.. IV d/c'd and discharge paperwork already given by previous RN. Pt and wife verbalized understanding of instructions. Stable for discharge on home O2.

## 2019-04-23 NOTE — Procedures (Signed)
Tunneled pleural catheter placement with imaging guidance (CPT 32550)  Indication: Malignant LEFT effusion  Consent: Signed in chart  Surgeon: Erskine Emery MD  Anesthesia: Fentanyl 159mcg for coughing  Procedure - Timeout performed - Site of effusion marked with [place] - Area cleaned, prepped, and draped - 10cc 1% subcutaneous lidocaine injected to anesthetize area - 1.5 cm incision made overlying fluid and another about 5 cm anterior to this along chest wall - PleurX catheter inserted in usual sterile fashion using modified seldinger technique - Cathter hooked to suction - After fluid aspirated, pleurX capped and sterile dressing applied  Findings - 1000 cc's of amber fluid obtained - CXR pending  Specimen(s): None  Complications: none immediate

## 2019-04-24 LAB — CYTOLOGY - NON PAP

## 2019-04-24 MED ORDER — TRIAMCINOLONE ACETONIDE 0.1 % EX CREA: 1.0000 "application " | TOPICAL_CREAM | Freq: Two times a day (BID) | CUTANEOUS | 0 refills | Status: AC

## 2019-04-24 NOTE — Telephone Encounter (Signed)
Spoke with pt's wife, Langley Gauss. Pt has been scheduled to see Dr. Valeta Harms on 05/09/19 due to multiple appointments next week. Nothing further was needed.

## 2019-04-24 NOTE — Telephone Encounter (Signed)
Rash, noted yesterday, now itching, chest wall opposite pleurx. Rx triamcinalone and benadryl.  Erskine Emery MD

## 2019-04-24 NOTE — Progress Notes (Signed)
TOC CM contacted wife, Langley Gauss to follow up on oxygen and discharge. States they received oxygen prior to pt arriving home and she did not have any concerns or questions. [place], [facility] ED TOC CM [phone]

## 2019-04-25 ENCOUNTER — Inpatient Hospital Stay: Payer: Managed Care, Other (non HMO)

## 2019-04-25 LAB — BODY FLUID CULTURE: Culture: NO GROWTH

## 2019-04-27 ENCOUNTER — Encounter: Payer: Self-pay | Admitting: Internal Medicine

## 2019-05-01 ENCOUNTER — Inpatient Hospital Stay: Payer: Managed Care, Other (non HMO)

## 2019-05-01 ENCOUNTER — Encounter: Payer: Self-pay | Admitting: Internal Medicine

## 2019-05-01 ENCOUNTER — Telehealth: Payer: Self-pay | Admitting: Medical Oncology

## 2019-05-01 ENCOUNTER — Observation Stay (HOSPITAL_COMMUNITY): Payer: Managed Care, Other (non HMO)

## 2019-05-01 ENCOUNTER — Inpatient Hospital Stay (HOSPITAL_BASED_OUTPATIENT_CLINIC_OR_DEPARTMENT_OTHER): Payer: Managed Care, Other (non HMO) | Admitting: Internal Medicine

## 2019-05-01 ENCOUNTER — Encounter: Payer: Self-pay | Admitting: Medical Oncology

## 2019-05-01 ENCOUNTER — Other Ambulatory Visit: Payer: Self-pay

## 2019-05-01 ENCOUNTER — Encounter (HOSPITAL_COMMUNITY): Payer: Self-pay | Admitting: Emergency Medicine

## 2019-05-01 ENCOUNTER — Ambulatory Visit: Payer: Managed Care, Other (non HMO)

## 2019-05-01 ENCOUNTER — Other Ambulatory Visit: Payer: Managed Care, Other (non HMO)

## 2019-05-01 ENCOUNTER — Inpatient Hospital Stay (HOSPITAL_COMMUNITY)
Admission: EM | Admit: 2019-05-01 | Discharge: 2019-06-06 | DRG: 640 | Disposition: E | Payer: Managed Care, Other (non HMO) | Source: Ambulatory Visit | Attending: Internal Medicine | Admitting: Internal Medicine

## 2019-05-01 ENCOUNTER — Ambulatory Visit: Payer: Managed Care, Other (non HMO) | Admitting: Physician Assistant

## 2019-05-01 VITALS — BP 114/70 | HR 109 | Temp 97.9°F | Resp 18 | Ht 68.0 in | Wt 141.5 lb

## 2019-05-01 DIAGNOSIS — E871 Hypo-osmolality and hyponatremia: Secondary | ICD-10-CM | POA: Diagnosis not present

## 2019-05-01 DIAGNOSIS — F419 Anxiety disorder, unspecified: Secondary | ICD-10-CM | POA: Diagnosis present

## 2019-05-01 DIAGNOSIS — C3492 Malignant neoplasm of unspecified part of left bronchus or lung: Secondary | ICD-10-CM

## 2019-05-01 DIAGNOSIS — E869 Volume depletion, unspecified: Secondary | ICD-10-CM | POA: Diagnosis present

## 2019-05-01 DIAGNOSIS — R079 Chest pain, unspecified: Secondary | ICD-10-CM

## 2019-05-01 DIAGNOSIS — Z825 Family history of asthma and other chronic lower respiratory diseases: Secondary | ICD-10-CM

## 2019-05-01 DIAGNOSIS — J9611 Chronic respiratory failure with hypoxia: Secondary | ICD-10-CM | POA: Diagnosis present

## 2019-05-01 DIAGNOSIS — Z79899 Other long term (current) drug therapy: Secondary | ICD-10-CM

## 2019-05-01 DIAGNOSIS — Z5111 Encounter for antineoplastic chemotherapy: Secondary | ICD-10-CM

## 2019-05-01 DIAGNOSIS — E875 Hyperkalemia: Principal | ICD-10-CM | POA: Diagnosis present

## 2019-05-01 DIAGNOSIS — Z515 Encounter for palliative care: Secondary | ICD-10-CM | POA: Diagnosis not present

## 2019-05-01 DIAGNOSIS — Z66 Do not resuscitate: Secondary | ICD-10-CM | POA: Diagnosis not present

## 2019-05-01 DIAGNOSIS — Z87891 Personal history of nicotine dependence: Secondary | ICD-10-CM

## 2019-05-01 DIAGNOSIS — Z7189 Other specified counseling: Secondary | ICD-10-CM | POA: Diagnosis not present

## 2019-05-01 DIAGNOSIS — G9341 Metabolic encephalopathy: Secondary | ICD-10-CM | POA: Diagnosis present

## 2019-05-01 DIAGNOSIS — J910 Malignant pleural effusion: Secondary | ICD-10-CM | POA: Diagnosis not present

## 2019-05-01 DIAGNOSIS — Z800 Family history of malignant neoplasm of digestive organs: Secondary | ICD-10-CM

## 2019-05-01 DIAGNOSIS — Z801 Family history of malignant neoplasm of trachea, bronchus and lung: Secondary | ICD-10-CM

## 2019-05-01 DIAGNOSIS — Z9221 Personal history of antineoplastic chemotherapy: Secondary | ICD-10-CM

## 2019-05-01 DIAGNOSIS — Z20822 Contact with and (suspected) exposure to covid-19: Secondary | ICD-10-CM | POA: Diagnosis present

## 2019-05-01 DIAGNOSIS — C7951 Secondary malignant neoplasm of bone: Secondary | ICD-10-CM | POA: Diagnosis present

## 2019-05-01 DIAGNOSIS — J90 Pleural effusion, not elsewhere classified: Secondary | ICD-10-CM

## 2019-05-01 DIAGNOSIS — Z5112 Encounter for antineoplastic immunotherapy: Secondary | ICD-10-CM | POA: Diagnosis not present

## 2019-05-01 DIAGNOSIS — E222 Syndrome of inappropriate secretion of antidiuretic hormone: Secondary | ICD-10-CM | POA: Diagnosis present

## 2019-05-01 DIAGNOSIS — C3490 Malignant neoplasm of unspecified part of unspecified bronchus or lung: Secondary | ICD-10-CM

## 2019-05-01 LAB — CBC WITH DIFFERENTIAL (CANCER CENTER ONLY)
Abs Immature Granulocytes: 0.17 10*3/uL — ABNORMAL HIGH (ref 0.00–0.07)
Basophils Absolute: 0.1 10*3/uL (ref 0.0–0.1)
Basophils Relative: 0 %
Eosinophils Absolute: 0 10*3/uL (ref 0.0–0.5)
Eosinophils Relative: 0 %
HCT: 49.9 % (ref 39.0–52.0)
Hemoglobin: 16.9 g/dL (ref 13.0–17.0)
Immature Granulocytes: 1 %
Lymphocytes Relative: 3 %
Lymphs Abs: 0.8 10*3/uL (ref 0.7–4.0)
MCH: 29.2 pg (ref 26.0–34.0)
MCHC: 33.9 g/dL (ref 30.0–36.0)
MCV: 86.3 fL (ref 80.0–100.0)
Monocytes Absolute: 1.2 10*3/uL — ABNORMAL HIGH (ref 0.1–1.0)
Monocytes Relative: 5 %
Neutro Abs: 22.4 10*3/uL — ABNORMAL HIGH (ref 1.7–7.7)
Neutrophils Relative %: 91 %
Platelet Count: 477 10*3/uL — ABNORMAL HIGH (ref 150–400)
RBC: 5.78 MIL/uL (ref 4.22–5.81)
RDW: 12.6 % (ref 11.5–15.5)
WBC Count: 24.6 10*3/uL — ABNORMAL HIGH (ref 4.0–10.5)
nRBC: 0 % (ref 0.0–0.2)

## 2019-05-01 LAB — CBC WITH DIFFERENTIAL/PLATELET
Abs Immature Granulocytes: 0.2 10*3/uL — ABNORMAL HIGH (ref 0.00–0.07)
Basophils Absolute: 0.1 10*3/uL (ref 0.0–0.1)
Basophils Relative: 0 %
Eosinophils Absolute: 0 10*3/uL (ref 0.0–0.5)
Eosinophils Relative: 0 %
HCT: 48.6 % (ref 39.0–52.0)
Hemoglobin: 16.2 g/dL (ref 13.0–17.0)
Immature Granulocytes: 1 %
Lymphocytes Relative: 2 %
Lymphs Abs: 0.4 10*3/uL — ABNORMAL LOW (ref 0.7–4.0)
MCH: 29.7 pg (ref 26.0–34.0)
MCHC: 33.3 g/dL (ref 30.0–36.0)
MCV: 89.2 fL (ref 80.0–100.0)
Monocytes Absolute: 0.4 10*3/uL (ref 0.1–1.0)
Monocytes Relative: 2 %
Neutro Abs: 25.4 10*3/uL — ABNORMAL HIGH (ref 1.7–7.7)
Neutrophils Relative %: 95 %
Platelets: 504 10*3/uL — ABNORMAL HIGH (ref 150–400)
RBC: 5.45 MIL/uL (ref 4.22–5.81)
RDW: 12.8 % (ref 11.5–15.5)
WBC: 26.5 10*3/uL — ABNORMAL HIGH (ref 4.0–10.5)
nRBC: 0 % (ref 0.0–0.2)

## 2019-05-01 LAB — CMP (CANCER CENTER ONLY)
ALT: 68 U/L — ABNORMAL HIGH (ref 0–44)
AST: 20 U/L (ref 15–41)
Albumin: 2.3 g/dL — ABNORMAL LOW (ref 3.5–5.0)
Alkaline Phosphatase: 179 U/L — ABNORMAL HIGH (ref 38–126)
Anion gap: 13 (ref 5–15)
BUN: 24 mg/dL — ABNORMAL HIGH (ref 6–20)
CO2: 24 mmol/L (ref 22–32)
Calcium: 8.6 mg/dL — ABNORMAL LOW (ref 8.9–10.3)
Chloride: 89 mmol/L — ABNORMAL LOW (ref 98–111)
Creatinine: 1.05 mg/dL (ref 0.61–1.24)
GFR, Est AFR Am: >60 mL/min (ref >60)
GFR, Estimated: >60 mL/min (ref >60)
Glucose, Bld: 131 mg/dL — ABNORMAL HIGH (ref 70–99)
Potassium: 6.4 mmol/L (ref 3.5–5.1)
Sodium: 126 mmol/L — ABNORMAL LOW (ref 135–145)
Total Bilirubin: 0.5 mg/dL (ref 0.3–1.2)
Total Protein: 5.6 g/dL — ABNORMAL LOW (ref 6.5–8.1)

## 2019-05-01 LAB — BASIC METABOLIC PANEL
Anion gap: 10 (ref 5–15)
Anion gap: 11 (ref 5–15)
BUN: 25 mg/dL — ABNORMAL HIGH (ref 6–20)
BUN: 27 mg/dL — ABNORMAL HIGH (ref 6–20)
CO2: 25 mmol/L (ref 22–32)
CO2: 24 mmol/L (ref 22–32)
Calcium: 8.2 mg/dL — ABNORMAL LOW (ref 8.9–10.3)
Calcium: 8.2 mg/dL — ABNORMAL LOW (ref 8.9–10.3)
Chloride: 88 mmol/L — ABNORMAL LOW (ref 98–111)
Chloride: 88 mmol/L — ABNORMAL LOW (ref 98–111)
Creatinine, Ser: 1.15 mg/dL (ref 0.61–1.24)
Creatinine, Ser: 1.17 mg/dL (ref 0.61–1.24)
GFR calc Af Amer: >60 mL/min (ref >60)
GFR calc Af Amer: >60 mL/min (ref >60)
GFR calc non Af Amer: >60 mL/min (ref >60)
GFR calc non Af Amer: >60 mL/min (ref >60)
Glucose, Bld: 127 mg/dL — ABNORMAL HIGH (ref 70–99)
Glucose, Bld: 148 mg/dL — ABNORMAL HIGH (ref 70–99)
Potassium: 6.6 mmol/L (ref 3.5–5.1)
Potassium: 5.9 mmol/L — ABNORMAL HIGH (ref 3.5–5.1)
Sodium: 123 mmol/L — ABNORMAL LOW (ref 135–145)
Sodium: 123 mmol/L — ABNORMAL LOW (ref 135–145)

## 2019-05-01 LAB — TSH: TSH: 3.385 u[IU]/mL (ref 0.320–4.118)

## 2019-05-01 LAB — HEPATIC FUNCTION PANEL
ALT: 58 U/L — ABNORMAL HIGH (ref 0–44)
AST: 22 U/L (ref 15–41)
Albumin: 2.4 g/dL — ABNORMAL LOW (ref 3.5–5.0)
Alkaline Phosphatase: 132 U/L — ABNORMAL HIGH (ref 38–126)
Bilirubin, Direct: 0.2 mg/dL (ref 0.0–0.2)
Indirect Bilirubin: 0.6 mg/dL (ref 0.3–0.9)
Total Bilirubin: 0.8 mg/dL (ref 0.3–1.2)
Total Protein: 5.8 g/dL — ABNORMAL LOW (ref 6.5–8.1)

## 2019-05-01 LAB — CBG MONITORING, ED: Glucose-Capillary: 133 mg/dL — ABNORMAL HIGH (ref 70–99)

## 2019-05-01 LAB — SARS CORONAVIRUS 2 (TAT 6-24 HRS): SARS Coronavirus 2: NEGATIVE

## 2019-05-01 LAB — POTASSIUM: Potassium: 6.7 mmol/L (ref 3.5–5.1)

## 2019-05-01 LAB — URIC ACID: Uric Acid, Serum: 5.9 mg/dL (ref 3.7–8.6)

## 2019-05-01 LAB — PHOSPHORUS: Phosphorus: 4.1 mg/dL (ref 2.5–4.6)

## 2019-05-01 MED ORDER — OXYCODONE HCL 5 MG PO TABS
10.0000 mg | ORAL_TABLET | ORAL | Status: DC | PRN
  Administered 2019-05-02 – 2019-05-05 (×12): 10 mg via ORAL
  Filled 2019-05-01 (×12): qty 2

## 2019-05-01 MED ORDER — SODIUM ZIRCONIUM CYCLOSILICATE 5 G PO PACK
5.0000 g | PACK | Freq: Two times a day (BID) | ORAL | Status: AC
  Administered 2019-05-01 – 2019-05-02 (×2): 5 g via ORAL
  Filled 2019-05-01 (×2): qty 1

## 2019-05-01 MED ORDER — DEXTROSE 50 % IV SOLN
1.0000 | Freq: Once | INTRAVENOUS | Status: AC
  Administered 2019-05-01: 50 mL via INTRAVENOUS
  Filled 2019-05-01: qty 50

## 2019-05-01 MED ORDER — FENTANYL 25 MCG/HR TD PT72
1.0000 | MEDICATED_PATCH | TRANSDERMAL | Status: DC
  Administered 2019-05-01 – 2019-05-04 (×2): 1 via TRANSDERMAL
  Filled 2019-05-01 (×2): qty 1

## 2019-05-01 MED ORDER — DEXAMETHASONE SODIUM PHOSPHATE 10 MG/ML IJ SOLN
INTRAMUSCULAR | Status: AC
  Filled 2019-05-01: qty 1

## 2019-05-01 MED ORDER — PALONOSETRON HCL INJECTION 0.25 MG/5ML
0.2500 mg | Freq: Once | INTRAVENOUS | Status: AC
  Administered 2019-05-01: 13:00:00 0.25 mg via INTRAVENOUS

## 2019-05-01 MED ORDER — ALBUTEROL SULFATE (2.5 MG/3ML) 0.083% IN NEBU: 2.5000 mg | INHALATION_SOLUTION | Freq: Four times a day (QID) | RESPIRATORY_TRACT | Status: DC | PRN

## 2019-05-01 MED ORDER — MORPHINE SULFATE (PF) 4 MG/ML IV SOLN
INTRAVENOUS | Status: AC
  Filled 2019-05-01: qty 1

## 2019-05-01 MED ORDER — DEXAMETHASONE SODIUM PHOSPHATE 10 MG/ML IJ SOLN
10.0000 mg | Freq: Once | INTRAMUSCULAR | Status: AC
  Administered 2019-05-01: 10 mg via INTRAVENOUS

## 2019-05-01 MED ORDER — SODIUM CHLORIDE 0.9 % IV SOLN: INTRAVENOUS | Status: AC

## 2019-05-01 MED ORDER — ONDANSETRON HCL 4 MG/2ML IJ SOLN
4.0000 mg | Freq: Four times a day (QID) | INTRAMUSCULAR | Status: DC | PRN
  Administered 2019-05-03: 4 mg via INTRAVENOUS
  Filled 2019-05-01: qty 2

## 2019-05-01 MED ORDER — SODIUM CHLORIDE 0.9 % IV SOLN
Freq: Once | INTRAVENOUS | Status: AC
  Filled 2019-05-01: qty 250

## 2019-05-01 MED ORDER — BISACODYL 10 MG RE SUPP
10.0000 mg | Freq: Every day | RECTAL | Status: DC | PRN
  Administered 2019-05-03: 10 mg via RECTAL
  Filled 2019-05-01: qty 1

## 2019-05-01 MED ORDER — DOCUSATE SODIUM 100 MG PO CAPS
100.0000 mg | ORAL_CAPSULE | Freq: Two times a day (BID) | ORAL | Status: DC
  Administered 2019-05-01 – 2019-05-06 (×9): 100 mg via ORAL
  Filled 2019-05-01 (×11): qty 1

## 2019-05-01 MED ORDER — ADULT MULTIVITAMIN W/MINERALS CH
1.0000 | ORAL_TABLET | Freq: Every day | ORAL | Status: DC
  Administered 2019-05-01 – 2019-05-06 (×6): 1 via ORAL
  Filled 2019-05-01 (×5): qty 1

## 2019-05-01 MED ORDER — FENTANYL CITRATE (PF) 100 MCG/2ML IJ SOLN
25.0000 ug | Freq: Once | INTRAMUSCULAR | Status: AC
  Administered 2019-05-01: 25 ug via INTRAVENOUS
  Filled 2019-05-01: qty 2

## 2019-05-01 MED ORDER — PALONOSETRON HCL INJECTION 0.25 MG/5ML
INTRAVENOUS | Status: AC
  Filled 2019-05-01: qty 5

## 2019-05-01 MED ORDER — MORPHINE SULFATE 4 MG/ML IJ SOLN
2.0000 mg | Freq: Once | INTRAMUSCULAR | Status: AC
  Administered 2019-05-01: 2 mg via INTRAVENOUS
  Filled 2019-05-01: qty 1

## 2019-05-01 MED ORDER — FENTANYL 25 MCG/HR TD PT72: 1.0000 | MEDICATED_PATCH | TRANSDERMAL | 0 refills | Status: AC

## 2019-05-01 MED ORDER — SODIUM CHLORIDE 0.9 % IV SOLN
500.0000 mg/m2 | Freq: Once | INTRAVENOUS | Status: DC
  Filled 2019-05-01: qty 36

## 2019-05-01 MED ORDER — SODIUM ZIRCONIUM CYCLOSILICATE 5 G PO PACK
5.0000 g | PACK | Freq: Once | ORAL | Status: AC
  Administered 2019-05-01: 5 g via ORAL
  Filled 2019-05-01: qty 1

## 2019-05-01 MED ORDER — BENZONATATE 100 MG PO CAPS
200.0000 mg | ORAL_CAPSULE | Freq: Three times a day (TID) | ORAL | Status: DC | PRN
  Administered 2019-05-02 – 2019-05-03 (×4): 200 mg via ORAL
  Filled 2019-05-01 (×4): qty 2

## 2019-05-01 MED ORDER — SODIUM CHLORIDE 0.9 % IV SOLN
150.0000 mg | Freq: Once | INTRAVENOUS | Status: AC
  Administered 2019-05-01: 150 mg via INTRAVENOUS
  Filled 2019-05-01: qty 150

## 2019-05-01 MED ORDER — SODIUM CHLORIDE 0.9 % IV SOLN
465.5000 mg | Freq: Once | INTRAVENOUS | Status: DC
  Filled 2019-05-01: qty 47

## 2019-05-01 MED ORDER — ONDANSETRON 4 MG PO TBDP: 4.0000 mg | ORAL_TABLET | Freq: Three times a day (TID) | ORAL | Status: DC | PRN

## 2019-05-01 MED ORDER — INSULIN ASPART 100 UNIT/ML IV SOLN
10.0000 [IU] | Freq: Once | INTRAVENOUS | Status: AC
  Administered 2019-05-01: 10 [IU] via INTRAVENOUS
  Filled 2019-05-01: qty 0.1

## 2019-05-01 MED ORDER — ENOXAPARIN SODIUM 40 MG/0.4ML ~~LOC~~ SOLN
40.0000 mg | SUBCUTANEOUS | Status: DC
  Administered 2019-05-01 – 2019-05-05 (×5): 40 mg via SUBCUTANEOUS
  Filled 2019-05-01 (×5): qty 0.4

## 2019-05-01 MED ORDER — FOLIC ACID 1 MG PO TABS
1.0000 mg | ORAL_TABLET | Freq: Every day | ORAL | Status: DC
  Administered 2019-05-02 – 2019-05-06 (×5): 1 mg via ORAL
  Filled 2019-05-01 (×5): qty 1

## 2019-05-01 MED ORDER — ONDANSETRON HCL 4 MG PO TABS
4.0000 mg | ORAL_TABLET | Freq: Four times a day (QID) | ORAL | Status: DC | PRN
  Administered 2019-05-06: 4 mg via ORAL
  Filled 2019-05-01: qty 1

## 2019-05-01 MED ORDER — ALBUTEROL SULFATE (2.5 MG/3ML) 0.083% IN NEBU: 10.0000 mg | INHALATION_SOLUTION | Freq: Once | RESPIRATORY_TRACT | Status: DC

## 2019-05-01 MED ORDER — SODIUM CHLORIDE 0.9 % IV SOLN: 10.0000 mg | Freq: Once | INTRAVENOUS | Status: DC

## 2019-05-01 MED ORDER — SODIUM CHLORIDE 0.9 % IV SOLN
200.0000 mg | Freq: Once | INTRAVENOUS | Status: AC
  Administered 2019-05-01: 200 mg via INTRAVENOUS
  Filled 2019-05-01: qty 8

## 2019-05-01 MED ORDER — SODIUM ZIRCONIUM CYCLOSILICATE 10 G PO PACK: 10.0000 g | PACK | Freq: Two times a day (BID) | ORAL | Status: DC

## 2019-05-01 MED ORDER — IBUPROFEN 200 MG PO TABS: 600.0000 mg | ORAL_TABLET | Freq: Four times a day (QID) | ORAL | Status: DC | PRN

## 2019-05-01 NOTE — Progress Notes (Signed)
Lab called with critical value of Potassium (repeat) 6.7 with no hemolysis.  Hand delivered to desk nurses Diane and [place] who are supporting [place] today.  Gardiner Rhyme, RN

## 2019-05-01 NOTE — Telephone Encounter (Signed)
Confirmed appts today . Pt is not feeling well .

## 2019-05-01 NOTE — Progress Notes (Signed)
[facility] [phone]   [phone]  OFFICE PROGRESS NOTE  Glenda Chroman, MD [address] [place] [address]  DIAGNOSIS: stage IVB (T4, N2, M1c) non-small cell lung cancer likely lung primary based on the imaging studies diagnosed in February 2021 with unclear subtype from pathology.  The patient presented with large left upper lobe lung mass in addition to left hilar and mediastinal lymphadenopathy and metastatic nodules and the left upper lobe as well as right upper lobe in addition to bone metastasis in the right iliac bone.  MOLECULAR STUDY by [facility]: [id number], 4.5%, None   PRIOR THERAPY: None  CURRENT THERAPY: Systemic chemotherapy with carboplatin for AUC of 5, Alimta 500 mg/M2 and Keytruda 200 mg IV every 3 weeks.  1st dose 04/14/2019  INTERVAL HISTORY: Jazir Newey 58 y.o. male returns to the clinic today for follow-up visit accompanied by his wife.  The patient continues to complain of increasing fatigue and weakness as well as frequent nausea and vomiting.  He is currently on Compazine and Zofran with some improvement.  He has 3 episodes of nausea and vomiting last week.  He also continues to complain of pain on the left side of the chest worse with cough.  He continues to lose weight.  He denied having any current fever or chills.  He has shortness of breath at baseline increased with exertion.  He was recently admitted to the hospital with shortness of breath and he has left-sided thoracentesis by pulmonary medicine with drainage of 1 L of pleural fluid.  The patient use Percocet on as-needed basis for his pain management but not enough.  He had molecular studies performed by [facility] that showed no actionable mutations.  He is here today for evaluation before starting the 1st cycle of his systemic chemotherapy.Marland Kitchen   MEDICAL HISTORY: Past Medical History:  Diagnosis Date  . Cancer ([facility])    lung cancer  . Dyspnea    with exertion    . PONV (postoperative nausea and vomiting)     ALLERGIES:  has No Known Allergies.  MEDICATIONS:  Current Outpatient Medications  Medication Sig Dispense Refill  . acetaminophen (TYLENOL) 500 MG tablet Take 1 tablet (500 mg total) by mouth 3 (three) times daily. 30 tablet 0  . albuterol (PROVENTIL) (2.5 MG/3ML) 0.083% nebulizer solution Take 3 mLs (2.5 mg total) by nebulization every 6 (six) hours as needed for wheezing or shortness of breath. 75 mL 12  . benzonatate (TESSALON) 200 MG capsule Take 1 capsule (200 mg total) by mouth 3 (three) times daily as needed for cough. 30 capsule 1  . docusate sodium (COLACE) 100 MG capsule Take 100 mg by mouth 2 (two) times daily.    . fluticasone furoate-vilanterol (BREO ELLIPTA) 100-25 MCG/INH AEPB Inhale 1 puff into the lungs daily. 28 each 0  . folic acid (FOLVITE) 1 MG tablet Take 1 tablet (1 mg total) by mouth daily. 30 tablet 4  . HYDROcodone-homatropine (HYCODAN) 5-1.5 MG/5ML syrup Take 5 mLs by mouth every 6 (six) hours as needed for cough. (Patient taking differently: Take 5 mLs by mouth at bedtime. ) 240 mL 0  . ibuprofen (ADVIL) 200 MG tablet Take 600 mg by mouth every 6 (six) hours as needed for fever, headache or moderate pain.    . Multiple Vitamin (MULTIVITAMIN WITH MINERALS) TABS tablet Take 1 tablet by mouth daily.    . nicotine (NICODERM CQ - DOSED IN MG/24 HOURS) 21  mg/24hr patch Place 21 mg onto the skin daily.    . ondansetron (ZOFRAN ODT) 4 MG disintegrating tablet Take 1 tablet (4 mg total) by mouth every 8 (eight) hours as needed for nausea or vomiting. 20 tablet 0  . oxyCODONE 10 MG TABS Take 1 tablet (10 mg total) by mouth every 4 (four) hours as needed for moderate pain. 20 tablet 0  . oxyCODONE-acetaminophen (PERCOCET/ROXICET) 5-325 MG tablet Take 1 tablet by mouth every 6 (six) hours as needed for severe pain.    . predniSONE (DELTASONE) 10 MG tablet Take 1 tablet (10 mg total) by mouth daily with breakfast for 7 days. 7  tablet 0  . prochlorperazine (COMPAZINE) 10 MG tablet Take 1 tablet (10 mg total) by mouth every 6 (six) hours as needed for nausea or vomiting. 30 tablet 0  . triamcinolone cream (KENALOG) 0.1 % Apply 1 application topically 2 (two) times daily. 80 g 0   No current facility-administered medications for this visit.    SURGICAL HISTORY:  Past Surgical History:  Procedure Laterality Date  . APPENDECTOMY    . BRONCHIAL NEEDLE ASPIRATION BIOPSY  04/02/2019   Procedure: BRONCHIAL NEEDLE ASPIRATION BIOPSIES;  Surgeon: Candee Furbish, MD;  Location: [facility] ENDOSCOPY;  Service: Endoscopy;;  . ENDOBRONCHIAL ULTRASOUND  04/02/2019   Procedure: ENDOBRONCHIAL ULTRASOUND;  Surgeon: Candee Furbish, MD;  Location: [facility];  Service: Endoscopy;;  . HIP SURGERY Right   . INGUINAL HERNIA REPAIR    . SHOULDER SURGERY Right   . VIDEO BRONCHOSCOPY N/A 04/02/2019   Procedure: VIDEO BRONCHOSCOPY WITHOUT FLUORO;  Surgeon: Candee Furbish, MD;  Location: [facility] ENDOSCOPY;  Service: Endoscopy;  Laterality: N/A;  . WISDOM TOOTH EXTRACTION      REVIEW OF SYSTEMS:  Constitutional: positive for anorexia, fatigue and weight loss Eyes: negative Ears, nose, mouth, throat, and face: negative Respiratory: positive for cough, dyspnea on exertion and pleurisy/chest pain Cardiovascular: negative Gastrointestinal: positive for nausea and vomiting Genitourinary:negative Integument/breast: negative Hematologic/lymphatic: negative Musculoskeletal:positive for muscle weakness Neurological: negative Behavioral/Psych: negative Endocrine: negative Allergic/Immunologic: negative   PHYSICAL EXAMINATION: General appearance: alert, cooperative, cachectic, fatigued and no distress Head: Normocephalic, without obvious abnormality, atraumatic Neck: no adenopathy, no JVD, supple, symmetrical, trachea midline and thyroid not enlarged, symmetric, no tenderness/mass/nodules Lymph nodes: Cervical, supraclavicular, and axillary nodes  normal. Resp: diminished breath sounds LLL and dullness to percussion LLL Back: symmetric, no curvature. ROM normal. No CVA tenderness. Cardio: regular rate and rhythm, S1, S2 normal, no murmur, click, rub or gallop GI: soft, non-tender; bowel sounds normal; no masses,  no organomegaly Extremities: extremities normal, atraumatic, no cyanosis or edema Neurologic: Alert and oriented X 3, normal strength and tone. Normal symmetric reflexes. Normal coordination and gait  ECOG PERFORMANCE STATUS: 1 - Symptomatic but completely ambulatory  Blood pressure 114/70, pulse (!) 109, temperature 97.9 F (36.6 C), temperature source Temporal, resp. rate 18, height 5\' 8"  (1.727 m), weight 141 lb 8 oz (64.2 kg), SpO2 100 %.  LABORATORY DATA: Lab Results  Component Value Date   WBC 24.6 (H) 04/21/2019   HGB 16.9 04/26/2019   HCT 49.9 04/13/2019   MCV 86.3 04/28/2019   PLT 477 (H) 05/04/2019      Chemistry      Component Value Date/Time   NA 136 04/22/2019 0308   K 4.1 04/22/2019 0308   CL 102 04/22/2019 0308   CO2 26 04/22/2019 0308   BUN 24 (H) 04/22/2019 0308   CREATININE 1.15 04/22/2019 0308   CREATININE  1.20 04/18/2019 1430      Component Value Date/Time   CALCIUM 8.2 (L) 04/22/2019 0308   ALKPHOS 82 04/21/2019 1238   AST 22 04/21/2019 1238   AST 13 (L) 04/18/2019 1430   ALT 48 (H) 04/21/2019 1238   ALT 37 04/18/2019 1430   BILITOT 0.6 04/21/2019 1238   BILITOT 0.3 04/18/2019 1430       RADIOGRAPHIC STUDIES: DG Chest 1 View  Result Date: 04/23/2019 CLINICAL DATA:  Chest tube EXAM: CHEST  1 VIEW COMPARISON:  04/23/2019 FINDINGS: Left chest tube in place. Continued complete opacification of the left hemithorax. Patchy opacity in the right lower lung. Heart likely normal size. No acute bony abnormality. IMPRESSION: Interval placement of left chest tube with continued complete opacification of the left hemithorax. Right basilar opacity concerning for pneumonia. Electronically Signed    By: Rolm Baptise M.D.   On: 04/23/2019 08:55   DG Chest 2 View  Result Date: 04/05/2019 CLINICAL DATA:  History of a lung mass. EXAM: CHEST - 2 VIEW COMPARISON:  PA and lateral chest 03/07/2019.  CT chest 03/20/2019. FINDINGS: Left upper lobe mass lesion appears somewhat larger than on the prior plain film. There is a new small left pleural effusion. Lungs are emphysematous. Heart size is normal. No acute bony abnormality. 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 New small left pleural effusion. Emphysema. Electronically Signed   By: Inge Rise M.D.   On: 04/05/2019 11:52   CT Angio Chest PE W and/or Wo Contrast  Result Date: 04/21/2019 CLINICAL DATA:  Shortness of breath and near complete opacification of the left hemithorax. EXAM: CT ANGIOGRAPHY CHEST WITH CONTRAST TECHNIQUE: Multidetector CT imaging of the chest was performed using the standard protocol during bolus administration of intravenous contrast. Multiplanar CT image reconstructions and MIPs were obtained to evaluate the vascular anatomy. CONTRAST:  52mL OMNIPAQUE IOHEXOL 350 MG/ML SOLN COMPARISON:  Chest x-ray from earlier in the same day as well as a CT from 04/07/2019 FINDINGS: Cardiovascular: Thoracic aorta demonstrates a normal branching pattern without aneurysmal dilatation or dissection. No cardiac enlargement is seen. The pulmonary artery is well visualized with a normal branching pattern. Some attenuation of the left upper lobe branches are seen related to the underlying disease process. No definitive filling defect to suggest pulmonary embolism is seen. Mediastinum/Nodes: Thoracic inlet is within normal limits. Scattered small mediastinal lymph nodes are again identified. More prominent soft tissue density is noted in the subcarinal region when compared with the prior exam consistent with adenopathy. This measures approximately 2 cm in  dimension. The esophagus as visualized appears within normal limits. Lungs/Pleura: Large left-sided pleural effusion is seen. Considerable consolidation in the upper and lower lobes is noted. Density difference is noted in the left perihilar region consistent with underlying mass measuring approximately 5.7 x 5.5 cm. This has enlarged in size when compared with the prior exam at which time it measured 4.5 x 3.2 cm. No definitive pleural enhancement is seen. As described above there is splaying of the upper lobe pulmonary arterial branches identified. Upper Abdomen: Visualized upper abdomen is within normal limits. Musculoskeletal: Bony structures show mild compression deformity of T11 stable from the prior exam. Review of the MIP images confirms the above findings. IMPRESSION: No evidence of pulmonary embolism. Significant increase in left-sided pleural effusion consistent with the underlying neoplasm. The central left perihilar lesion has increased in size as described above. Associated consolidation  is noted as well. Prominent soft tissue density in the subcarinal region is noted consistent with lymphadenopathy. Electronically Signed   By: Inez Catalina M.D.   On: 04/21/2019 14:10   MR BRAIN W WO CONTRAST  Result Date: 04/11/2019 CLINICAL DATA:  Metastatic lung cancer. Head trauma with headache. Rule out metastatic disease. EXAM: MRI HEAD WITHOUT AND WITH CONTRAST TECHNIQUE: Multiplanar, multiecho pulse sequences of the brain and surrounding structures were obtained without and with intravenous contrast. CONTRAST:  55mL GADAVIST GADOBUTROL 1 MMOL/ML IV SOLN COMPARISON:  None. FINDINGS: Brain: Ventricle size normal. Negative for infarct, hemorrhage, mass. No fluid collection or midline shift. Normal enhancement.  Negative for metastatic disease. Vascular: Normal arterial flow voids. Skull and upper cervical spine: No focal skeletal lesion. Sinuses/Orbits: Mucosal edema paranasal sinuses.  Normal orbit none Other:  Mild motion degraded study. IMPRESSION: Negative MRI brain with contrast.  Negative for metastatic disease. Electronically Signed   By: Franchot Gallo M.D.   On: 04/11/2019 19:10   NM PET Image Initial (PI) Skull Base To Thigh  Result Date: 04/10/2019 CLINICAL DATA:  Initial treatment strategy for recently diagnosed left upper lobe non-small cell lung cancer. EXAM: NUCLEAR MEDICINE PET SKULL BASE TO THIGH TECHNIQUE: 7.7 mCi F-18 FDG was injected intravenously. Full-ring PET imaging was performed from the skull base to thigh after the radiotracer. CT data was obtained and used for attenuation correction and anatomic localization. Fasting blood glucose: 106 mg/dl COMPARISON:  03/20/2019 chest CT. FINDINGS: Mediastinal blood pool activity: SUV max 3.1 Liver activity: SUV max NA NECK: No enlarged or hypermetabolic lymph nodes in the neck. Curvilinear hypermetabolism in a right scalene muscle is probably activity related. Incidental CT findings: Mucous retention cyst versus polyp in the inferior right maxillary sinus. Nonhypermetabolic 2.7 cm right thyroid nodule. CHEST: Infiltrative hypermetabolic 8.2 x 5.1 cm central left upper lobe lung mass (series 8/image 32), contiguous with the left hilum, with max SUV 13.4. Additional hypermetabolic left upper lobe pulmonary nodules measuring 2.6 cm peripherally with max SUV 9.1 (series 8/image 26) and 1.7 cm inferiorly with max SUV 7.8 (series 8/image 39). Right lower lobe 0.4 cm solid pulmonary nodule is below PET resolution (series 8/image 31). Enlarged hypermetabolic 1.3 cm subcarinal node with max SUV 10.1 (series 4/image 75). Left hilar nodal hypermetabolism with max SUV 8.1. Hypermetabolic 0.9 cm AP window node with max SUV 5.7 (series 4/image 70). Left paraspinal focus of hypermetabolism at the T12 level with max SUV 4.9, without discrete CT correlate. Incidental CT findings: Small dependent left pleural effusion. Moderate centrilobular emphysema. ABDOMEN/PELVIS: No  abnormal hypermetabolic activity within the liver, pancreas, adrenal glands, or spleen. No hypermetabolic lymph nodes in the abdomen or pelvis. Incidental CT findings: none SKELETON: Hypermetabolic lytic 0.9 cm right iliac bone lesion with max SUV 7.9 (series 4/image 164). No additional skeletal foci of hypermetabolism. Incidental CT findings: none IMPRESSION: 1. Infiltrative hypermetabolic 8.2 cm central left upper lobe lung mass, contiguous with the left hilum, compatible with primary bronchogenic carcinoma. 2. Two additional hypermetabolic left upper lobe pulmonary nodules, compatible with pulmonary metastases to the same lobe. Right lower lobe 0.4 cm pulmonary nodule is below PET resolution and warrants attention on follow-up chest CT in 3 months. 3. Small dependent left pleural effusion. Nonspecific left T12 paraspinal focus of hypermetabolism without discrete CT correlate, cannot exclude paraspinal/extreme medial basilar left pleural metastasis. 4. Hypermetabolic left hilar, subcarinal and AP window nodal metastases. 5. 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 6. PET-CT staging is  Stage IVA (T4 N2 M1b). 7.  Emphysema (ICD10-J43.9). Electronically Signed   By: Ilona Sorrel M.D.   On: 04/10/2019 09:59   DG CHEST PORT 1 VIEW  Result Date: 04/23/2019 CLINICAL DATA:  Pleural effusion. EXAM: PORTABLE CHEST 1 VIEW COMPARISON:  Radiograph yesterday. CT 04/21/2019 FINDINGS: Complete opacification of the left hemithorax with decreased aeration of the upper lobe since yesterday. Suggestion of increasing effusion with slight rightward tracheal deviation and increased conspicuity of the right heart border. Mild atelectasis at the right lung base. No visualized pneumothorax. IMPRESSION: Complete opacification of the left hemithorax with decreased aeration of the upper lobe since yesterday. Suggestion of increasing effusion with rightward tracheal deviation and increased conspicuity of the right  heart border. Electronically Signed   By: Keith Rake M.D.   On: 04/23/2019 05:43   DG CHEST PORT 1 VIEW  Result Date: 04/22/2019 CLINICAL DATA:  Status post thoracentesis EXAM: PORTABLE CHEST 1 VIEW COMPARISON:  04/22/2019 FINDINGS: Cardiac shadow is obscured by the large left pleural effusion. Only minimal aeration of the left upper lobe is noted. Continued opacification of the left hemithorax is seen consistent with the large effusion and underlying mass lesion. Right lung demonstrates mild right basilar atelectasis. No pneumothorax is seen. IMPRESSION: Stable appearance of the chest. Electronically Signed   By: Inez Catalina M.D.   On: 04/22/2019 11:42   DG CHEST PORT 1 VIEW  Result Date: 04/22/2019 CLINICAL DATA:  Pleural effusion. EXAM: PORTABLE CHEST 1 VIEW COMPARISON:  Radiograph and CT yesterday FINDINGS: Large left pleural effusion with near complete opacification the left hemithorax. Minimal portion of the left upper lobe is aerated. Mediastinal contours are obscured. Minimal patchy right lung base opacities likely atelectasis. No evidence of pulmonary edema. No pneumothorax. IMPRESSION: Large left pleural effusion with near complete opacification of the left hemithorax. Minimal portion of the left upper lobe aerated. Minimal right lung base atelectasis. Electronically Signed   By: Keith Rake M.D.   On: 04/22/2019 04:39   DG CHEST PORT 1 VIEW  Result Date: 04/21/2019 CLINICAL DATA:  Status post LEFT thoracentesis. EXAM: PORTABLE CHEST 1 VIEW COMPARISON:  Plain film from earlier same day. FINDINGS: Persistent near complete opacification of the LEFT hemithorax. RIGHT lung is clear. No pneumothorax seen. IMPRESSION: Persistent near complete opacification of the LEFT hemithorax. No pneumothorax seen. Electronically Signed   By: Franki Cabot M.D.   On: 04/21/2019 17:37   DG Chest Port 1 View  Result Date: 04/21/2019 CLINICAL DATA:  Pt with hx of lung ca, reports right rib pain/chest  pain and cough. EXAM: PORTABLE CHEST 1 VIEW COMPARISON:  Chest radiograph 04/05/2019 FINDINGS: The visualized cardiomediastinal contours are stable. There is near complete opacification of the left hemithorax likely secondary to atelectasis and pleural effusion. Right lung is clear. No acute finding in the visualized skeleton. IMPRESSION: Near complete opacification of the left hemithorax likely secondary to atelectasis and pleural effusion. Electronically Signed   By: Audie Pinto M.D.   On: 04/21/2019 12:44    ASSESSMENT AND PLAN: This is a very pleasant 59 years old white male recently diagnosed with a stage IVb non-small cell lung cancer, presented with large left upper lobe lung mass in addition to left hilar and mediastinal lymphadenopathy and metastatic nodule in the left upper lobe as well as right upper lobe and bone metastasis in the right iliac crest diagnosed in February 2021.  The patient has no actionable mutations. He is here today for evaluation before starting the 1st  dose of systemic chemotherapy with carboplatin for AUC of 5, Alimta 500 mg/M2 and Keytruda 200 mg IV every 3 weeks. I discussed with the patient and his wife again the systemic chemotherapy regiment as well as the adverse effects.  He was also offered palliative care and hospice referral.  They would like to proceed with the treatment as planned. For the nausea and vomiting, he will continue his current treatment with Compazine as well as Zofran. For pain management I will start the patient on fentanyl patch 25 mcg/hour every 3 days and he will continue with oxycodone for breakthrough pain. We will see the patient back for follow-up visit next week for reevaluation and management of any adverse effect of his treatment. The patient was advised to call immediately if he has any other concerning symptoms in the interval. The patient voices understanding of current disease status and treatment options and is in agreement  with the current care plan.  All questions were answered. The patient knows to call the clinic with any problems, questions or concerns. We can certainly see the patient much sooner if necessary.  The total time spent in the appointment was 30 minutes.  Disclaimer: This note was dictated with voice recognition software. Similar sounding words can inadvertently be transcribed and may not be corrected upon review.

## 2019-05-01 NOTE — ED Provider Notes (Signed)
[place] DEPT Provider Note   CSN: [id number] Arrival date & time: 04/21/2019  1510     History Chief Complaint  Patient presents with  . hyperkalemia    from cancer center    George Mullen is a 59 y.o. male.  59 year old male with past medical history of non-small cell lung cancer presents with report of hyperkalemia.  Patient was at the cancer center today for treatment, was informed that his potassium was high at 6.7 and was sent to the emergency room for treatment.  Patient reports feeling at his baseline, no acute symptoms or complaints today.  Patient has a previous malignant pleural effusion, has a drain in place on his left and axillary chest wall which he reports emptying daily, denies any changes in output.  Denies recent fevers.  No other complaints or concerns today.        Past Medical History:  Diagnosis Date  . Cancer ([place])    lung cancer  . Dyspnea    with exertion  . PONV (postoperative nausea and vomiting)     Patient Active Problem List   Diagnosis Date Noted  . Hyperkalemia 04/30/2019  . Hyponatremia 04/22/2019  . Right-sided chest pain 04/21/2019  . Malignant pleural effusion 04/21/2019  . Hypoxia 04/21/2019  . Non-small cell cancer of left lung ([place]) 04/18/2019  . Goals of care, counseling/discussion 04/18/2019  . Encounter for antineoplastic chemotherapy 04/18/2019  . Encounter for antineoplastic immunotherapy 04/18/2019  . S/P bronchoscopy 04/04/2019  . Cough 04/04/2019  . Abnormal findings on diagnostic imaging of lung 04/04/2019  . Former smoker 04/04/2019    Past Surgical History:  Procedure Laterality Date  . APPENDECTOMY    . BRONCHIAL NEEDLE ASPIRATION BIOPSY  04/02/2019   Procedure: BRONCHIAL NEEDLE ASPIRATION BIOPSIES;  Surgeon: Candee Furbish, MD;  Location[facility]COPY;  Service: Endoscopy;;  . ENDOBRONCHIAL ULTRASOUND  04/02/2019   Procedure: ENDOBRONCHIAL ULTRASOUND;  Surgeon: Candee Furbish,  MD;  Location: [place];  Service: Endoscopy;;  . HIP SURGERY Right   . INGUINAL HERNIA REPAIR    . SHOULDER SURGERY Right   . VIDEO BRONCHOSCOPY N/A 04/02/2019   Procedure: VIDEO BRONCHOSCOPY WITHOUT FLUORO;  Surgeon: Candee Furbish, MD;  Location: [facility] ENDOSCOPY;  Service: Endoscopy;  Laterality: N/A;  . WISDOM TOOTH EXTRACTION         Family History  Problem Relation Age of Onset  . COPD Mother   . Lung cancer Maternal Grandmother   . Stomach cancer Paternal Grandmother     Social History   Tobacco Use  . Smoking status: Former Smoker    Packs/day: 1.50    Years: 45.00    Pack years: 67.50    Types: Cigarettes    Quit date: 03/18/2019    Years since quitting: 0.1  . Smokeless tobacco: Never Used  Substance Use Topics  . Alcohol use: Yes    Comment: Occasional Beer  . Drug use: Never    Home Medications Prior to Admission medications   Medication Sig Start Date End Date Taking? Authorizing Provider  albuterol (PROVENTIL) (2.5 MG/3ML) 0.083% nebulizer solution Take 3 mLs (2.5 mg total) by nebulization every 6 (six) hours as needed for wheezing or shortness of breath. 04/02/19  Yes Candee Furbish, MD  benzonatate (TESSALON) 200 MG capsule Take 1 capsule (200 mg total) by mouth 3 (three) times daily as needed for cough. 04/04/19  Yes Lauraine Rinne, NP  docusate sodium (COLACE) 100 MG capsule Take 100  mg by mouth 2 (two) times daily.   Yes [provider]  folic acid (FOLVITE) 1 MG tablet Take 1 tablet (1 mg total) by mouth daily. 04/18/19  Yes Curt Bears, MD  ibuprofen (ADVIL) 200 MG tablet Take 600 mg by mouth every 6 (six) hours as needed for fever, headache or moderate pain.   Yes [provider]  Multiple Vitamin (MULTIVITAMIN WITH MINERALS) TABS tablet Take 1 tablet by mouth daily.   Yes [provider]  ondansetron (ZOFRAN ODT) 4 MG disintegrating tablet Take 1 tablet (4 mg total) by mouth every 8 (eight) hours as needed for nausea or  vomiting. 04/11/19  Yes Albrizze, Kaitlyn E, PA-C  oxyCODONE 10 MG TABS Take 1 tablet (10 mg total) by mouth every 4 (four) hours as needed for moderate pain. 04/23/19  Yes Donne Hazel, MD  oxyCODONE-acetaminophen (PERCOCET/ROXICET) 5-325 MG tablet Take 1 tablet by mouth every 6 (six) hours as needed for severe pain.   Yes [provider]  triamcinolone cream (KENALOG) 0.1 % Apply 1 application topically 2 (two) times daily. 04/24/19  Yes Candee Furbish, MD  acetaminophen (TYLENOL) 500 MG tablet Take 1 tablet (500 mg total) by mouth 3 (three) times daily. Patient not taking: Reported on 04/25/2019 04/23/19   Donne Hazel, MD  fentaNYL (DURAGESIC) 25 MCG/HR Place 1 patch onto the skin every 3 (three) days. 04/28/2019   Curt Bears, MD  fluticasone furoate-vilanterol (BREO ELLIPTA) 100-25 MCG/INH AEPB Inhale 1 puff into the lungs daily. Patient not taking: Reported on 04/15/2019 03/27/19   Candee Furbish, MD  HYDROcodone-homatropine [facility]) 5-1.5 MG/5ML syrup Take 5 mLs by mouth every 6 (six) hours as needed for cough. Patient not taking: Reported on 04/22/2019 04/02/19   Lauraine Rinne, NP    Allergies    Patient has no known allergies.  Review of Systems   Review of Systems  Constitutional: Negative for fever.  Cardiovascular: Negative for chest pain.  Gastrointestinal: Negative for abdominal pain, diarrhea, nausea and vomiting.  Skin: Negative for wound.  Allergic/Immunologic: Positive for immunocompromised state.  Neurological: Negative for weakness.  Psychiatric/Behavioral: Negative for confusion.  All other systems reviewed and are negative.   Physical Exam Updated Vital Signs BP 116/72   Pulse 95   Temp 97.7 F (36.5 C) (Oral)   Resp 20   SpO2 97%   Physical Exam Vitals and nursing note reviewed.  Constitutional:      General: He is not in acute distress.    Appearance: He is well-developed. He is not diaphoretic.  HENT:     Head: Normocephalic and  atraumatic.  Cardiovascular:     Rate and Rhythm: Normal rate and regular rhythm.     Pulses: Normal pulses.     Heart sounds: Normal heart sounds.  Pulmonary:     Effort: Pulmonary effort is normal.     Breath sounds: Normal breath sounds.  Chest:    Musculoskeletal:     Right lower leg: No edema.     Left lower leg: No edema.  Skin:    General: Skin is warm and dry.  Neurological:     Mental Status: He is alert and oriented to person, place, and time.  Psychiatric:        Behavior: Behavior normal.     ED Results / Procedures / Treatments   Labs (all labs ordered are listed, but only abnormal results are displayed) Labs Reviewed  BASIC METABOLIC PANEL - Abnormal; Notable for  the following components:      Result Value   Sodium 123 (*)    Potassium 6.6 (*)    Chloride 88 (*)    Glucose, Bld 127 (*)    BUN 25 (*)    Calcium 8.2 (*)    All other components within normal limits  CBC WITH DIFFERENTIAL/PLATELET - Abnormal; Notable for the following components:   WBC 26.5 (*)    Platelets 504 (*)    Neutro Abs 25.4 (*)    Lymphs Abs 0.4 (*)    Abs Immature Granulocytes 0.20 (*)    All other components within normal limits  HEPATIC FUNCTION PANEL - Abnormal; Notable for the following components:   Total Protein 5.8 (*)    Albumin 2.4 (*)    ALT 58 (*)    Alkaline Phosphatase 132 (*)    All other components within normal limits  CBG MONITORING, ED - Abnormal; Notable for the following components:   Glucose-Capillary 133 (*)    All other components within normal limits  SARS CORONAVIRUS 2 (TAT 6-24 HRS)  URIC ACID  PHOSPHORUS    EKG EKG Interpretation  Date/Time:  Wednesday May 01 2019 15:19:53 EST Ventricular Rate:  96 PR Interval:    QRS Duration: 80 QT Interval:  308 QTC Calculation: 390 R Axis:   18 Text Interpretation: Sinus rhythm Borderline short PR interval Inferior infarct, acute (LCx) Lateral leads are also involved peaked T waves borderline  ST elevation  inferiorly, suspect hyperkalemia rather than STEMI Confirmed by Theotis Burrow [phone]) on 04/21/2019 3:58:56 PM   Radiology No results found.  Procedures .Critical Care Performed by: Tacy Learn, PA-C Authorized by: Tacy Learn, PA-C   Critical care provider statement:    Critical care time (minutes):  45   Critical care was time spent personally by me on the following activities:  Discussions with consultants, evaluation of patient's response to treatment, examination of patient, ordering and performing treatments and interventions, ordering and review of laboratory studies, ordering and review of radiographic studies, pulse oximetry, re-evaluation of patient's condition, obtaining history from patient or surrogate and review of old charts   (including critical care time)  Medications Ordered in ED Medications  albuterol (PROVENTIL) (2.5 MG/3ML) 0.083% nebulizer solution 10 mg (0 mg Nebulization Hold 04/18/2019 1622)  0.9 %  sodium chloride infusion (has no administration in time range)  sodium zirconium cyclosilicate (LOKELMA) packet 5 g (5 g Oral Given 04/30/2019 1704)  insulin aspart (novoLOG) injection 10 Units (10 Units Intravenous Given 04/09/2019 1629)    And  dextrose 50 % solution 50 mL (50 mLs Intravenous Given 05/05/2019 1624)    ED Course  I have reviewed the triage vital signs and the nursing notes.  Pertinent labs & imaging results that were available during my care of the patient were reviewed by me and considered in my medical decision making (see chart for details).  Clinical Course as of Apr 30 1802  Wed May 01, 6671  6716 59 year old male sent by oncology for hyperkalemia today.  Patient with history of stage IV lung cancer, has left side drain in place.  Patient states he had just started his first round of treatment today when he was told that his potassium was high, history postop and he was sent to the ER.  Patient denies any new symptoms or change  in his baseline feeling of generally unwell. Patient was found to have a potassium of 6.6, sodium of 123, calcium of 8.2,  change from baseline.  EKG with peaked T waves.  Patient is not on supplemental potassium.  Case discussed with Dr. Rex Kras, ER attending, concern for tumor lysis syndrome.  Hyperkalemia order set initiated.  Consult to Dr. Irene Limbo with hematology however request consult to Dr. Earlie Server as this is patient's oncologist. Call to Dr. Louanne Belton, [facility] hospitalist who will consult for admission.    [LM]    Clinical Course User Index [LM] Roque Lias   MDM Rules/Calculators/A&P                      Final Clinical Impression(s) / ED Diagnoses Final diagnoses:  Hyperkalemia  Hyponatremia    Rx / DC Orders ED Discharge Orders    None       Roque Lias 04/29/2019 1804    Little, Wenda Overland, MD 05/02/19 2007

## 2019-05-01 NOTE — Progress Notes (Signed)
OK to treat today per MD [facility], will repeat potassium level today prior to leaving.

## 2019-05-01 NOTE — Progress Notes (Signed)
Patient received George Mullen only today prior to going to ED for hyperkalemia. Remainder of treatment deferred to later in the week or when able to receive treatment.  [name], PharmD, [facility] Pharmacist Pharmacy Phone: [phone] 04/21/2019

## 2019-05-01 NOTE — ED Triage Notes (Signed)
Pt sent from cancer center for hyperkalemia. Got part of his first cancer treatment then stopped when repeat potassium came back high. RN reports patient had two EKGs one said stemi and the other didn't Provider there stated wasn't stemi but advised to be seen in ED.  Morphine 2mg  about hour ago. Reports pain with couging on side with drain.

## 2019-05-01 NOTE — Telephone Encounter (Signed)
Told his wife that pt is going to ED for critical potassium. Facemask for oxygen- I told wife  to contact the provider who ordered [employer] oxygen.

## 2019-05-01 NOTE — Progress Notes (Signed)
I called pt on 04/30/19 to introduce myself as his Arboriculturist and to discuss copay assistance.  Pt would like to apply so I completed the [employer] enrollment form for [facility], got the pt's and Dr. Worthy Flank signature and faxed today for processing.  I also completed the [place] application for [employer], got the pt's and Dr. Worthy Flank signature and faxedtoday for processing.  I will notify the pt of the outcome once received.  Pt is overqualified for the [employer].  He has my card for any questions or concerns he may have in the future.

## 2019-05-01 NOTE — H&P (Signed)
[facility] History and Physical  George Mullen HKV:[id number] DOB: 05-27-1960 DOA: 04/22/2019  Referring physician: ED  PCP: George Chroman, MD   Patient is coming from:  Home  Chief Complaint:   HPI: George Mullen is a 59 y.o. 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  Patient had gone to oncology clinic today when he had his blood work which showed hyperkalemia.  EKG showed some T wave changes so patient was sent to the ED for further evaluation and treatment.  Patient does have history of lung cancer and left sided pigtail catheter for recurrent malignant pleural effusion.  Patient states that he has been having some cough at home which is not unusual.  He has some chest pain while he is coughing.  He denies overt dyspnea but has been draining his Pleurx catheter at home.  Complains of generalized fatigue, weakness intermittent nausea and episodes of vomiting.  Does not have a good appetite.  Has been moving bowels every couple of days.  Denies any urinary urgency, frequency dysuria but has dark urine.  Feels thirsty and has been drinking a lot of lemonade and water.  Patient denies fever, chills or rigor.  Denies sick contacts.  Denies syncope but has lightheadedness and dizziness.  ED Course: In the ED, patient was noted to have potassium of 6.6 with sodium of 123.  WBC was elevated at 26.5.  Patient appeared to be mildly volume depleted.  Patient received temporizing measures including insulin D50 and Lokelma in the ED.  He was put on telemetry monitor and  was then considered for observation in the hospital.  Review of Systems:  All systems were reviewed and were negative unless otherwise mentioned in the HPI  Past Medical History:  Diagnosis Date  . Cancer ([place])    lung cancer  . Dyspnea    with exertion  . PONV (postoperative nausea and vomiting)    Past Surgical History:    Procedure Laterality Date  . APPENDECTOMY    . BRONCHIAL NEEDLE ASPIRATION BIOPSY  04/02/2019   Procedure: BRONCHIAL NEEDLE ASPIRATION BIOPSIES;  Surgeon: Candee Furbish, MD;  Location[facility] ENDOSCOPY;  Service: Endoscopy;;  . ENDOBRONCHIAL ULTRASOUND  04/02/2019   Procedure: ENDOBRONCHIAL ULTRASOUND;  Surgeon: Candee Furbish, MD;  Location: [place];  Service: Endoscopy;;  . HIP SURGERY Right   . INGUINAL HERNIA REPAIR    . SHOULDER SURGERY Right   . VIDEO BRONCHOSCOPY N/A 04/02/2019   Procedure: VIDEO BRONCHOSCOPY WITHOUT FLUORO;  Surgeon: Candee Furbish, MD;  Location: [facility] ENDOSCOPY;  Service: Endoscopy;  Laterality: N/A;  . WISDOM TOOTH EXTRACTION      Social History:  reports that he quit smoking about 6 weeks ago. His smoking use included cigarettes. He has a 67.50 pack-year smoking history. He has never used smokeless tobacco. He reports current alcohol use. He reports that he does not use drugs.  No Known Allergies  Family History  Problem Relation Age of Onset  . COPD Mother   . Lung cancer Maternal Grandmother   . Stomach cancer Paternal Grandmother      Prior to Admission medications   Medication Sig Start Date End Date Taking? Authorizing Provider  albuterol (PROVENTIL) (2.5 MG/3ML) 0.083% nebulizer solution Take 3 mLs (2.5 mg total) by nebulization every 6 (six) hours as needed for wheezing or shortness of breath. 04/02/19  Yes Candee Furbish, MD  benzonatate (TESSALON) 200  MG capsule Take 1 capsule (200 mg total) by mouth 3 (three) times daily as needed for cough. 04/04/19  Yes Lauraine Rinne, NP  docusate sodium (COLACE) 100 MG capsule Take 100 mg by mouth 2 (two) times daily.   Yes [provider]  folic acid (FOLVITE) 1 MG tablet Take 1 tablet (1 mg total) by mouth daily. 04/18/19  Yes Curt Bears, MD  ibuprofen (ADVIL) 200 MG tablet Take 600 mg by mouth every 6 (six) hours as needed for fever, headache or moderate pain.   Yes [provider]   Multiple Vitamin (MULTIVITAMIN WITH MINERALS) TABS tablet Take 1 tablet by mouth daily.   Yes [provider]  ondansetron (ZOFRAN ODT) 4 MG disintegrating tablet Take 1 tablet (4 mg total) by mouth every 8 (eight) hours as needed for nausea or vomiting. 04/11/19  Yes Albrizze, Kaitlyn E, PA-C  oxyCODONE 10 MG TABS Take 1 tablet (10 mg total) by mouth every 4 (four) hours as needed for moderate pain. 04/23/19  Yes Donne Hazel, MD  oxyCODONE-acetaminophen (PERCOCET/ROXICET) 5-325 MG tablet Take 1 tablet by mouth every 6 (six) hours as needed for severe pain.   Yes [provider]  triamcinolone cream (KENALOG) 0.1 % Apply 1 application topically 2 (two) times daily. 04/24/19  Yes Candee Furbish, MD  acetaminophen (TYLENOL) 500 MG tablet Take 1 tablet (500 mg total) by mouth 3 (three) times daily. Patient not taking: Reported on 04/16/2019 04/23/19   Donne Hazel, MD  fentaNYL (DURAGESIC) 25 MCG/HR Place 1 patch onto the skin every 3 (three) days. 05/02/2019   Curt Bears, MD  fluticasone furoate-vilanterol (BREO ELLIPTA) 100-25 MCG/INH AEPB Inhale 1 puff into the lungs daily. Patient not taking: Reported on 04/19/2019 03/27/19   Candee Furbish, MD  HYDROcodone-homatropine [facility]) 5-1.5 MG/5ML syrup Take 5 mLs by mouth every 6 (six) hours as needed for cough. Patient not taking: Reported on 04/29/2019 04/02/19   Lauraine Rinne, NP    Physical Exam: Vitals:   04/30/2019 1609 04/08/2019 1630 04/25/2019 1643 04/10/2019 1720  BP: 105/66  131/80 115/69  Pulse: 89 92 98 94  Resp: 17 19 20 18   Temp:      TempSrc:      SpO2: 96% 97% 97% 100%   Wt Readings from Last 3 Encounters:  04/25/2019 64.2 kg  04/21/19 70 kg  04/18/19 70.9 kg   There is no height or weight on file to calculate BMI.  General: Thinly built, on nasal cannula HENT: Normocephalic, pupils equally reacting to light and accommodation.  No scleral pallor or icterus noted. Oral mucosa is moist.  Chest: Diminished  breath sounds bilateral.  Left-sided Pleurx catheter in place with dressing. CVS: S1 &S2 heard. No murmur.  Regular rate and rhythm. Abdomen: Soft, nontender, nondistended.  Bowel sounds are heard.  Extremities: No cyanosis, clubbing or edema.  Peripheral pulses are palpable. Psych: Alert, awake and oriented, normal mood CNS:  No cranial nerve deficits.  Power equal in all extremities.   No cerebellar signs.   Skin: Warm and dry.  No rashes noted.  Labs on Admission:   CBC: Recent Labs  Lab 04/16/2019 1118 04/30/2019 1534  WBC 24.6* 26.5*  NEUTROABS 22.4* 25.4*  HGB 16.9 16.2  HCT 49.9 48.6  MCV 86.3 89.2  PLT 477* 504*    Basic Metabolic Panel: Recent Labs  Lab 04/18/2019 1118 04/18/2019 1356 04/22/2019 1534  NA 126*  --  123*  K 6.4* 6.7* 6.6*  CL 89*  --  88*  CO2 24  --  25  GLUCOSE 131*  --  127*  BUN 24*  --  25*  CREATININE 1.05  --  1.15  CALCIUM 8.6*  --  8.2*  PHOS  --   --  4.1    Liver Function Tests: Recent Labs  Lab 04/26/2019 1118 04/21/2019 1534  AST 20 22  ALT 68* 58*  ALKPHOS 179* 132*  BILITOT 0.5 0.8  PROT 5.6* 5.8*  ALBUMIN 2.3* 2.4*   No results for input(s): LIPASE, AMYLASE in the last 168 hours. No results for input(s): AMMONIA in the last 168 hours.  Cardiac Enzymes: No results for input(s): CKTOTAL, CKMB, CKMBINDEX, TROPONINI in the last 168 hours.  BNP (last 3 results) No results for input(s): BNP in the last 8760 hours.  ProBNP (last 3 results) No results for input(s): PROBNP in the last 8760 hours.  CBG: Recent Labs  Lab 04/23/2019 1739  GLUCAP 133*    Lipase  No results found for: LIPASE   Urinalysis No results found for: COLORURINE, APPEARANCEUR, LABSPEC, PHURINE, GLUCOSEU, HGBUR, BILIRUBINUR, KETONESUR, PROTEINUR, UROBILINOGEN, NITRITE, LEUKOCYTESUR   Drugs of Abuse  No results found for: [place], [place], [place], [place], [facility], [place]    Radiological Exams on Admission: No results found.  EKG: Personally  reviewed by me which shows tall T waves.  Assessment/Plan Principal Problem:   Hyperkalemia Active Problems:   Non-small cell cancer of left lung (H[facility])   Malignant pleural effusion   Hyponatremia   Hyperkalemia with EKG changes. Patient has been drinking a lot of Gatorade recently.  Unsure of etiology of hyperkalemia.  patient received temporizing measures in the ED.  We will continue with low potassium diet.  Telemetry monitor.  Repeat Lokelma if potassium is still high. Check bmp in next 4 hours.  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  Patient was supposed to start the chemotherapy today.  Oncology will be notified.  Hyponatremia.  Possibly secondary to volume depletion from nausea vomiting poor oral intake. Prior sodium levels within normal limits. Normal TSH. Will check urine sodium and lytes, osmolality, continue normal saline for today.   Leukocytosis. No fever, clear sputum. Could be reactive. Will monitor.  Unsure whether patient received steroids today.  Fatigue, debility.  Get PT evaluation.  DVT Prophylaxis Lovenox subcu  Consultant:  Oncology   Code Status: Full code   Microbiology None  Antibiotics:None   Family Communication:  Patients' condition and plan of care including tests being ordered have been discussed with the patient  who indicate understanding and agree with the plan.  Disposition Plan: Home    Severity of Illness: The appropriate patient status for this patient is OBSERVATION. Observation status is judged to be reasonable and necessary in order to provide the required intensity of service to ensure the patient's safety. The patient's presenting symptoms, physical exam findings, and initial radiographic and laboratory data in the context of their medical condition is felt to place them at decreased risk for further clinical deterioration. Furthermore, it is anticipated that the patient will be medically  stable for discharge from the hospital within 2 midnights of admission. The following factors support the patient status of observation.   Signed, Flora Lipps, MD [facility] 05/02/2019

## 2019-05-01 NOTE — Progress Notes (Signed)
Repeat Potassium resulted and did not decrease, no more treatment today per MD [facility] and patient will be taken to ER in wheelchair by myself after speaking with charge. Dr. Julien Nordmann did see the 2 EKG's taken by NT on the patient at 1433 and 1434, no new orders, just take to ER for evaluation.

## 2019-05-02 ENCOUNTER — Telehealth: Payer: Self-pay | Admitting: Internal Medicine

## 2019-05-02 ENCOUNTER — Encounter: Payer: Self-pay | Admitting: Internal Medicine

## 2019-05-02 DIAGNOSIS — E222 Syndrome of inappropriate secretion of antidiuretic hormone: Secondary | ICD-10-CM | POA: Diagnosis present

## 2019-05-02 DIAGNOSIS — C3490 Malignant neoplasm of unspecified part of unspecified bronchus or lung: Secondary | ICD-10-CM

## 2019-05-02 DIAGNOSIS — Z79899 Other long term (current) drug therapy: Secondary | ICD-10-CM | POA: Diagnosis not present

## 2019-05-02 DIAGNOSIS — F419 Anxiety disorder, unspecified: Secondary | ICD-10-CM | POA: Diagnosis present

## 2019-05-02 DIAGNOSIS — E875 Hyperkalemia: Secondary | ICD-10-CM | POA: Diagnosis present

## 2019-05-02 DIAGNOSIS — C3492 Malignant neoplasm of unspecified part of left bronchus or lung: Secondary | ICD-10-CM | POA: Diagnosis present

## 2019-05-02 DIAGNOSIS — Z87891 Personal history of nicotine dependence: Secondary | ICD-10-CM | POA: Diagnosis not present

## 2019-05-02 DIAGNOSIS — J910 Malignant pleural effusion: Secondary | ICD-10-CM | POA: Diagnosis present

## 2019-05-02 DIAGNOSIS — G9341 Metabolic encephalopathy: Secondary | ICD-10-CM | POA: Diagnosis present

## 2019-05-02 DIAGNOSIS — Z825 Family history of asthma and other chronic lower respiratory diseases: Secondary | ICD-10-CM | POA: Diagnosis not present

## 2019-05-02 DIAGNOSIS — J9611 Chronic respiratory failure with hypoxia: Secondary | ICD-10-CM | POA: Diagnosis present

## 2019-05-02 DIAGNOSIS — Z515 Encounter for palliative care: Secondary | ICD-10-CM | POA: Diagnosis not present

## 2019-05-02 DIAGNOSIS — Z801 Family history of malignant neoplasm of trachea, bronchus and lung: Secondary | ICD-10-CM | POA: Diagnosis not present

## 2019-05-02 DIAGNOSIS — Z20822 Contact with and (suspected) exposure to covid-19: Secondary | ICD-10-CM | POA: Diagnosis present

## 2019-05-02 DIAGNOSIS — Z66 Do not resuscitate: Secondary | ICD-10-CM | POA: Diagnosis not present

## 2019-05-02 DIAGNOSIS — C7951 Secondary malignant neoplasm of bone: Secondary | ICD-10-CM | POA: Diagnosis present

## 2019-05-02 DIAGNOSIS — J90 Pleural effusion, not elsewhere classified: Secondary | ICD-10-CM

## 2019-05-02 DIAGNOSIS — E871 Hypo-osmolality and hyponatremia: Secondary | ICD-10-CM | POA: Diagnosis not present

## 2019-05-02 DIAGNOSIS — Z9221 Personal history of antineoplastic chemotherapy: Secondary | ICD-10-CM | POA: Diagnosis not present

## 2019-05-02 DIAGNOSIS — Z800 Family history of malignant neoplasm of digestive organs: Secondary | ICD-10-CM | POA: Diagnosis not present

## 2019-05-02 DIAGNOSIS — G893 Neoplasm related pain (acute) (chronic): Secondary | ICD-10-CM | POA: Diagnosis not present

## 2019-05-02 DIAGNOSIS — Z7189 Other specified counseling: Secondary | ICD-10-CM | POA: Diagnosis not present

## 2019-05-02 DIAGNOSIS — E869 Volume depletion, unspecified: Secondary | ICD-10-CM | POA: Diagnosis present

## 2019-05-02 LAB — URINALYSIS, ROUTINE W REFLEX MICROSCOPIC
Bacteria, UA: NONE SEEN
Bilirubin Urine: NEGATIVE
Glucose, UA: NEGATIVE mg/dL
Hgb urine dipstick: NEGATIVE
Ketones, ur: NEGATIVE mg/dL
Leukocytes,Ua: NEGATIVE
Nitrite: NEGATIVE
Protein, ur: NEGATIVE mg/dL
Specific Gravity, Urine: 1.024 (ref 1.005–1.030)
pH: 6 (ref 5.0–8.0)

## 2019-05-02 LAB — CBC
HCT: 42.7 % (ref 39.0–52.0)
Hemoglobin: 14.5 g/dL (ref 13.0–17.0)
MCH: 30.1 pg (ref 26.0–34.0)
MCHC: 34 g/dL (ref 30.0–36.0)
MCV: 88.6 fL (ref 80.0–100.0)
Platelets: 451 10*3/uL — ABNORMAL HIGH (ref 150–400)
RBC: 4.82 MIL/uL (ref 4.22–5.81)
RDW: 12.8 % (ref 11.5–15.5)
WBC: 28.5 10*3/uL — ABNORMAL HIGH (ref 4.0–10.5)
nRBC: 0 % (ref 0.0–0.2)

## 2019-05-02 LAB — SODIUM, URINE, RANDOM: Sodium, Ur: <10 mmol/L

## 2019-05-02 LAB — COMPREHENSIVE METABOLIC PANEL
ALT: 49 U/L — ABNORMAL HIGH (ref 0–44)
AST: 23 U/L (ref 15–41)
Albumin: 2.2 g/dL — ABNORMAL LOW (ref 3.5–5.0)
Alkaline Phosphatase: 108 U/L (ref 38–126)
Anion gap: 12 (ref 5–15)
BUN: 25 mg/dL — ABNORMAL HIGH (ref 6–20)
CO2: 24 mmol/L (ref 22–32)
Calcium: 8 mg/dL — ABNORMAL LOW (ref 8.9–10.3)
Chloride: 89 mmol/L — ABNORMAL LOW (ref 98–111)
Creatinine, Ser: 0.96 mg/dL (ref 0.61–1.24)
GFR calc Af Amer: >60 mL/min (ref >60)
GFR calc non Af Amer: >60 mL/min (ref >60)
Glucose, Bld: 128 mg/dL — ABNORMAL HIGH (ref 70–99)
Potassium: 5.8 mmol/L — ABNORMAL HIGH (ref 3.5–5.1)
Sodium: 125 mmol/L — ABNORMAL LOW (ref 135–145)
Total Bilirubin: 0.7 mg/dL (ref 0.3–1.2)
Total Protein: 5.3 g/dL — ABNORMAL LOW (ref 6.5–8.1)

## 2019-05-02 LAB — BASIC METABOLIC PANEL
Anion gap: 9 (ref 5–15)
BUN: 23 mg/dL — ABNORMAL HIGH (ref 6–20)
CO2: 24 mmol/L (ref 22–32)
Calcium: 7.5 mg/dL — ABNORMAL LOW (ref 8.9–10.3)
Chloride: 90 mmol/L — ABNORMAL LOW (ref 98–111)
Creatinine, Ser: 0.93 mg/dL (ref 0.61–1.24)
GFR calc Af Amer: >60 mL/min (ref >60)
GFR calc non Af Amer: >60 mL/min (ref >60)
Glucose, Bld: 193 mg/dL — ABNORMAL HIGH (ref 70–99)
Potassium: 4.8 mmol/L (ref 3.5–5.1)
Sodium: 123 mmol/L — ABNORMAL LOW (ref 135–145)

## 2019-05-02 LAB — OSMOLALITY, URINE: Osmolality, Ur: 784 mOsm/kg (ref 300–900)

## 2019-05-02 LAB — GRAM STAIN

## 2019-05-02 MED ORDER — ALBUTEROL SULFATE (2.5 MG/3ML) 0.083% IN NEBU: 2.5000 mg | INHALATION_SOLUTION | RESPIRATORY_TRACT | Status: DC | PRN

## 2019-05-02 MED ORDER — HYDROCOD POLST-CPM POLST ER 10-8 MG/5ML PO SUER
5.0000 mL | Freq: Two times a day (BID) | ORAL | Status: DC
  Administered 2019-05-02 – 2019-05-05 (×7): 5 mL via ORAL
  Filled 2019-05-02 (×7): qty 5

## 2019-05-02 NOTE — Progress Notes (Signed)
Pleurx cathter drained 800 ml. Patient tolerated moderately well. No complains of pain and or discomfort. Will continue to monitor.

## 2019-05-02 NOTE — Telephone Encounter (Signed)
Called and spoke with patients wife. Scheduled f/u next week per los. Did not schedule weekly lab per wife's request to get them done closer to home. Sent msg to md to see if that was an option.

## 2019-05-02 NOTE — Progress Notes (Signed)
Physical Therapy Treatment Patient Details Name: George Mullen MRN: [id number] DOB: 05-14-1960 Today's Date: 05/02/2019   Created HEP for pt and provided paper copy as well as pt agreed to email and provided email.  HEP only provided, not reviewed.  Will hopefully be able to check back tomorrow and review with pt pending caseload.  Pt also provided with outpatient cancer rehab referral handout in case interested.  Access Code: [email or web address]  URL: [email or web address]  Date: 05/02/2019  Prepared by: Arnette Schaumann   Exercises Supine Ankle Pumps - 10 reps - 1 sets - 2x daily - 7x weekly Supine Quadricep Sets - 10 reps - 1 sets - 2x daily - 7x weekly Supine Heel Slide - 10 reps - 1 sets - 2x daily - 7x weekly Supine March - 10 reps - 1 sets - 2x daily - 7x weekly Supine Active Straight Leg Raise - 10 reps - 1 sets - 2x daily - 7x weekly Supine Shoulder Alphabet - 10 reps - 1 sets - 2x daily - 7x weekly Supine Chest Stretch with Arms Behind Head - 10 reps - 1 sets - 2x daily - 7x weekly Seated Long Arc Quad - 10 reps - 1 sets - 2x daily - 7x weekly Seated March - 10 reps - 1 sets - 2x daily - 7x weekly Seated Shoulder Flexion - 10 reps - 1 sets - 2x daily - 7x weekly Seated Shoulder Abduction - Thumbs Up - 10 reps - 1 sets - 2x daily - 7x weekly Sit to Stand without Arm Support - 10 reps - 1 sets - 2x daily - 7x weekly  [name],[name] 05/02/2019, 2:47 PM Jannette Spanner PT, DPT Acute Rehabilitation Services Office: [phone]

## 2019-05-02 NOTE — Evaluation (Signed)
Physical Therapy Evaluation Patient Details Name: George Mullen MRN: [id number] DOB: Aug 01, 1960 Today's Date: 05/02/2019   History of Present Illness  59 y.o. 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  Patient had gone to oncology clinic today when he had his blood work which showed hyperkalemia.  Clinical Impression  Pt admitted with above diagnosis.  Pt currently with functional limitations due to the deficits listed below (see PT Problem List). Pt will benefit from skilled PT to increase their independence and safety with mobility to allow discharge to the venue listed below.  Pt ambulated in hallway with RW and remained on 3L O2 [place]. Spo2 97% and HR in 90s.  Pt reports feeling fatigued and poor mobility at home.  Discussed HHPT vs OPPT however pt and spouse are requesting HEP at this time.     Follow Up Recommendations Home health PT(discussed HHPT vs OPPT, pt and spouse requesting HEP at this time)    Equipment Recommendations  None recommended by PT    Recommendations for Other Services       Precautions / Restrictions Precautions Precautions: Fall Precaution Comments: new 3L O2 baseline from home; pleural catheter      Mobility  Bed Mobility Overal bed mobility: Needs Assistance Bed Mobility: Sit to Supine       Sit to supine: Min guard;HOB elevated   General bed mobility comments: spouse gently assisted LEs  Transfers Overall transfer level: Needs assistance Equipment used: Rolling walker (2 wheeled) Transfers: Sit to/from Stand Sit to Stand: Min guard         General transfer comment: min/guard for safety  Ambulation/Gait Ambulation/Gait assistance: Min guard Gait Distance (Feet): 120 Feet Assistive device: Rolling walker (2 wheeled) Gait Pattern/deviations: Step-through pattern;Decreased stride length;Trunk flexed     General Gait Details: slow but  steady with RW, SPO2 97% on 3L O2 [place], distance to pt tolerance  Stairs            Wheelchair Mobility    Modified Rankin (Stroke Patients Only)       Balance Overall balance assessment: Needs assistance         Standing balance support: Bilateral upper extremity supported Standing balance-Leahy Scale: Poor Standing balance comment: reliant on at least one UE support                             Pertinent Vitals/Pain Pain Assessment: No/denies pain    Home Living Family/patient expects to be discharged to:: Private residence Living Arrangements: Spouse/significant other           Home Layout: Able to live on main level with bedroom/bathroom Home Equipment: Walker - 2 wheels Additional Comments: currently using 3L O2 at home    Prior Function Level of Independence: Independent with assistive device(s)         Comments: pt reports limited ambulation lately and increased fatigue, poor intake     Hand Dominance        Extremity/Trunk Assessment        Lower Extremity Assessment Lower Extremity Assessment: Generalized weakness    Cervical / Trunk Assessment Cervical / Trunk Assessment: Normal  Communication   Communication: No difficulties  Cognition Arousal/Alertness: Awake/alert Behavior During Therapy: WFL for tasks assessed/performed Overall Cognitive Status: Within Functional Limits for tasks assessed  General Comments      Exercises     Assessment/Plan    PT Assessment Patient needs continued PT services  PT Problem List Decreased balance;Decreased activity tolerance;Decreased knowledge of use of DME;Decreased mobility;Decreased strength       PT Treatment Interventions DME instruction;Therapeutic exercise;Gait training;Balance training;Stair training;Functional mobility training;Therapeutic activities;Patient/family education    PT Goals (Current goals can be  found in the Care Plan section)  Acute Rehab PT Goals PT Goal Formulation: With patient Time For Goal Achievement: 05/09/19 Potential to Achieve Goals: Good    Frequency Min 3X/week   Barriers to discharge        Co-evaluation               AM-PAC PT "6 Clicks" Mobility  Outcome Measure Help needed turning from your back to your side while in a flat bed without using bedrails?: None Help needed moving from lying on your back to sitting on the side of a flat bed without using bedrails?: A Little Help needed moving to and from a bed to a chair (including a wheelchair)?: A Little Help needed standing up from a chair using your arms (e.g., wheelchair or bedside chair)?: A Little Help needed to walk in hospital room?: A Little Help needed climbing 3-5 steps with a railing? : A Little 6 Click Score: 19    End of Session Equipment Utilized During Treatment: Oxygen Activity Tolerance: Patient tolerated treatment well Patient left: in bed;with call bell/phone within reach;with bed alarm set;with family/visitor present   PT Visit Diagnosis: Difficulty in walking, not elsewhere classified (R26.2);Muscle weakness (generalized) (M62.81)    Time: 8366-2947 PT Time Calculation (min) (ACUTE ONLY): 23 min   Charges:   PT Evaluation $PT Eval Low Complexity: 1 Low        Kati PT, DPT Acute Rehabilitation Services Office: [phone]   Kursten Kruk,[name] 05/02/2019, 1:19 PM

## 2019-05-02 NOTE — Telephone Encounter (Signed)
[employer] form put in triage file and work letter left upfront for wife to pick up.

## 2019-05-02 NOTE — Progress Notes (Signed)
Pt was approved w/ [employer] program for $25,000 for [place]im[place] from 01/01/19 through 04/30/20. Pt's copay will be $25 each infusion.

## 2019-05-02 NOTE — Progress Notes (Signed)
PROGRESS NOTE  George Mullen ZWC:[id number] DOB: [birth year] DOA: 04/12/2019 PCP: Glenda Chroman, MD   LOS: 0 days   Brief narrative: As per HPI,  George Mullen is a 59 y.o. 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  Patient had gone to oncology clinic today when he had his blood work which showed hyperkalemia.  EKG showed some T wave changes so patient was sent to the ED for further evaluation and treatment.  Patient does have history of lung cancer and left sided pigtail catheter for recurrent malignant pleural effusion.  Patient states that he has been having some cough at home which is not unusual.  He has some chest pain while he is coughing.  He denies overt dyspnea but has been draining his Pleurx catheter at home.  Complains of generalized fatigue, weakness intermittent nausea and episodes of vomiting.  Does not have a good appetite.  Has been moving bowels every couple of days.  Denies any urinary urgency, frequency dysuria but has dark urine.  Feels thirsty and has been drinking a lot of lemonade and water.  Patient denies fever, chills or rigor.  Denies sick contacts.  Denies syncope but has lightheadedness and dizziness.  ED Course: In the ED, patient was noted to have potassium of 6.6 with sodium of 123.  WBC was elevated at 26.5.  Patient appeared to be mildly volume depleted.  Patient received temporizing measures including insulin D50 and Lokelma in the ED.  He was put on telemetry monitor and  was then considered for observation in the hospital.  Assessment/Plan:  Principal Problem:   Hyperkalemia Active Problems:   Non-small cell cancer of left lung (HCC)   Malignant pleural effusion   Hyponatremia  Hyperkalemia with EKG changes.  Uncertain etiology so far but could be excess lemonade/ steroids. Patient received temporizing measures in the ED including insulin D50 calcium  gluconate.  EKG showed T wave elevation on presentation.  On telemetry monitor.  Received Lokelma x3 with improvement in potassium level 4.8 today.  We will continue to monitor closely.  Advised to avoid lemonade, juice.  Stage IVb lung cancer with malignant pleural effusion with chronic hypoxic respiratory failure on nasal cannula oxygen status post pigtail catheter. On oxygen on for few weeks at home, on 3 L.  Patient was in the cancer clinic yesterday and received some of the antineoplastic medications.  Was seen by Dr. Curt Bears.  Hyponatremia.   Sodium of 123 today. Possibly secondary to volume depletion from nausea vomiting poor oral intake.  Urinary sodium was less than 10.  Prior sodium levels within normal limits. Normal TSH.  We will continue with normal saline hydration.  Increase the rate 100 mL/h.  Urine osmolality pending.  Leukocytosis. No fever, clear sputum. Could be reactive but trending up. Rule out pleural fluid infection.  Will monitor. Patient was receiving prednisone prior to coming to the hospital.    We will send the pleural fluid for culture and sensitivity. Check UA.  Patient had a chest x-ray done yesterday which showed near complete opacification of the left hemithorax with Pleurx catheter in place.  We will have the less threshold for antibiotic if persisting leukocytosis/spikes fever.  Fatigue, generalized weakness, debility.    Patient was seen by physical therapy recommend home health PT on discharge.  VTE Prophylaxis: Lovenox subcu  Code Status: Full code  Family Communication: Spoke with the patient at  bedside. I also spoke with the patient's wife on the phone and updated her about the clinical condition of the patient.   Disposition Plan:   . Patient is from home . Likely disposition to home likely in 1 to 2 days.  We will change the patient's status to inpatient at this time due to need for IV fluids, closer monitoring of electrolytes and work-up for  leukocytosis. . Barriers to discharge: Electrolyte imbalances, hyponatremia, need for IV fluids, leukocytosis workup  Consultants:  None  Procedures:  None  Antibiotics:  . None so far  Anti-infectives (From admission, onward)   None     Subjective:  Today, patient was seen and examined at bedside.  Patient complains of cough, shortness of breath and generalized weakness.  Complains of mild chest pain on coughing.  Objective: Vitals:   05/02/19 0932 05/02/19 1325  BP: 131/83 120/75  Pulse: 91 81  Resp:  16  Temp: 98.1 F (36.7 C) 98.7 F (37.1 C)  SpO2: 97% 98%    Intake/Output Summary (Last 24 hours) at 05/02/2019 1413 Last data filed at 05/02/2019 1302 Gross per 24 hour  Intake 980.83 ml  Output 1450 ml  Net -469.17 ml   Filed Weights   04/08/2019 1836 05/02/19 0517  Weight: 65 kg 65.4 kg   Body mass index is 21.92 kg/m.   Physical Exam: GENERAL: Patient is alert awake and oriented. Not in obvious distress. Thinly built, on nasal canula oxygen HENT: No scleral pallor or icterus. Pupils equally reactive to light. Oral mucosa is moist NECK: is supple, no gross swelling noted. CHEST:    Left-sided Pleurx catheter in place with decreased breath sounds on the left. CVS: S1 and S2 heard, no murmur. Regular rate and rhythm.  ABDOMEN: Soft, non-tender, bowel sounds are present. EXTREMITIES: No edema. CNS: Cranial nerves are intact. No focal motor deficits. SKIN: warm and dry without rashes.  Data Review: I have personally reviewed the following laboratory data and studies,  CBC: Recent Labs  Lab 04/24/2019 1118 04/08/2019 1534 05/02/19 0238  WBC 24.6* 26.5* 28.5*  NEUTROABS 22.4* 25.4*  --   HGB 16.9 16.2 14.5  HCT 49.9 48.6 42.7  MCV 86.3 89.2 88.6  PLT 477* 504* 161*   Basic Metabolic Panel: Recent Labs  Lab 04/16/2019 1118 04/28/2019 1118 05/05/2019 1356 04/28/2019 1534 04/10/2019 1932 05/02/19 0238 05/02/19 1255  NA 126*  --   --  123* 123* 125* 123*    K 6.4*   < > 6.7* 6.6* 5.9* 5.8* 4.8  CL 89*  --   --  88* 88* 89* 90*  CO2 24  --   --  25 24 24 24   GLUCOSE 131*  --   --  127* 148* 128* 193*  BUN 24*  --   --  25* 27* 25* 23*  CREATININE 1.05  --   --  1.15 1.17 0.96 0.93  CALCIUM 8.6*  --   --  8.2* 8.2* 8.0* 7.5*  PHOS  --   --   --  4.1  --   --   --    < > = values in this interval not displayed.   Liver Function Tests: Recent Labs  Lab 04/29/2019 1118 04/28/2019 1534 05/02/19 0238  AST 20 22 23   ALT 68* 58* 49*  ALKPHOS 179* 132* 108  BILITOT 0.5 0.8 0.7  PROT 5.6* 5.8* 5.3*  ALBUMIN 2.3* 2.4* 2.2*   No results for input(s): LIPASE, AMYLASE in the last  168 hours. No results for input(s): AMMONIA in the last 168 hours. Cardiac Enzymes: No results for input(s): CKTOTAL, CKMB, CKMBINDEX, TROPONINI in the last 168 hours. BNP (last 3 results) No results for input(s): BNP in the last 8760 hours.  ProBNP (last 3 results) No results for input(s): PROBNP in the last 8760 hours.  CBG: Recent Labs  Lab 04/11/2019 1739  GLUCAP 133*   Recent Results (from the past 240 hour(s))  SARS CORONAVIRUS 2 (TAT 6-24 HRS) Nasopharyngeal Nasopharyngeal Swab     Status: None   Collection Time: 04/09/2019  3:43 PM   Specimen: Nasopharyngeal Swab  Result Value Ref Range Status   SARS Coronavirus 2 NEGATIVE NEGATIVE Final    Comment: (NOTE) SARS-CoV-2 target nucleic acids are NOT DETECTED. The SARS-CoV-2 RNA is generally detectable in upper and lower respiratory specimens during the acute phase of infection. Negative results do not preclude SARS-CoV-2 infection, do not rule out co-infections with other pathogens, and should not be used as the sole basis for treatment or other patient management decisions. Negative results must be combined with clinical observations, patient history, and epidemiological information. The expected result is Negative. Fact Sheet for Patients: SugarRoll.be Fact Sheet for  Healthcare Providers: https://www.woods-mathews.com/ This test is not yet approved or cleared by the [place] FDA and  has been authorized for detection and/or diagnosis of SARS-CoV-2 by FDA under an Emergency Use Authorization (EUA). This EUA will remain  in effect (meaning this test can be used) for the duration of the COVID-19 declaration under Section 56 4(b)(1) of the Act, 21 U.S.C. section 360bbb-3(b)(1), unless the authorization is terminated or revoked sooner. Performed at [facility], [place] [address], [place], [place] [address]      Studies: DG Chest Port 1 View  Result Date: 04/18/2019 CLINICAL DATA:  Pleural effusion. EXAM: PORTABLE CHEST 1 VIEW COMPARISON:  04/23/2019 FINDINGS: There is near complete opacification of the left hemithorax which has progressed since the prior study. A left-sided PleurX catheter is in place. The heart size is poorly evaluated. There is no obvious pneumothorax. No convincing right-sided pleural effusion. Left sided volume loss is suspected given the lack of significant mediastinal shift. IMPRESSION: Near complete opacification of the left hemithorax consistent with a large left-sided pleural effusion. A left-sided PleurX catheter is noted. Electronically Signed   By: Constance Holster M.D.   On: 05/03/2019 20:56      Flora Lipps, MD  [facility] 05/02/2019

## 2019-05-03 DIAGNOSIS — R5381 Other malaise: Secondary | ICD-10-CM

## 2019-05-03 DIAGNOSIS — J9611 Chronic respiratory failure with hypoxia: Secondary | ICD-10-CM

## 2019-05-03 LAB — BASIC METABOLIC PANEL
Anion gap: 8 (ref 5–15)
BUN: 23 mg/dL — ABNORMAL HIGH (ref 6–20)
CO2: 23 mmol/L (ref 22–32)
Calcium: 7.7 mg/dL — ABNORMAL LOW (ref 8.9–10.3)
Chloride: 90 mmol/L — ABNORMAL LOW (ref 98–111)
Creatinine, Ser: 0.94 mg/dL (ref 0.61–1.24)
GFR calc Af Amer: >60 mL/min (ref >60)
GFR calc non Af Amer: >60 mL/min (ref >60)
Glucose, Bld: 120 mg/dL — ABNORMAL HIGH (ref 70–99)
Potassium: 5.2 mmol/L — ABNORMAL HIGH (ref 3.5–5.1)
Sodium: 121 mmol/L — ABNORMAL LOW (ref 135–145)

## 2019-05-03 LAB — CBC
HCT: 42.8 % (ref 39.0–52.0)
Hemoglobin: 14.5 g/dL (ref 13.0–17.0)
MCH: 30 pg (ref 26.0–34.0)
MCHC: 33.9 g/dL (ref 30.0–36.0)
MCV: 88.6 fL (ref 80.0–100.0)
Platelets: 478 10*3/uL — ABNORMAL HIGH (ref 150–400)
RBC: 4.83 MIL/uL (ref 4.22–5.81)
RDW: 12.8 % (ref 11.5–15.5)
WBC: 25.9 10*3/uL — ABNORMAL HIGH (ref 4.0–10.5)
nRBC: 0 % (ref 0.0–0.2)

## 2019-05-03 LAB — LIPID PANEL
Cholesterol: 268 mg/dL — ABNORMAL HIGH (ref 0–200)
HDL: 33 mg/dL — ABNORMAL LOW (ref >40)
LDL Cholesterol: 177 mg/dL — ABNORMAL HIGH (ref 0–99)
Total CHOL/HDL Ratio: 8.1 RATIO
Triglycerides: 289 mg/dL — ABNORMAL HIGH (ref <150)
VLDL: 58 mg/dL — ABNORMAL HIGH (ref 0–40)

## 2019-05-03 LAB — CORTISOL: Cortisol, Plasma: 17.4 ug/dL

## 2019-05-03 LAB — OSMOLALITY: Osmolality: 258 mOsm/kg — ABNORMAL LOW (ref 275–295)

## 2019-05-03 MED ORDER — METOCLOPRAMIDE HCL 5 MG/ML IJ SOLN
10.0000 mg | Freq: Once | INTRAMUSCULAR | Status: AC
  Administered 2019-05-03: 10 mg via INTRAVENOUS
  Filled 2019-05-03: qty 2

## 2019-05-03 MED ORDER — PANTOPRAZOLE SODIUM 40 MG PO TBEC
40.0000 mg | DELAYED_RELEASE_TABLET | Freq: Two times a day (BID) | ORAL | Status: DC
  Administered 2019-05-04 – 2019-05-06 (×4): 40 mg via ORAL
  Filled 2019-05-03 (×6): qty 1

## 2019-05-03 MED ORDER — BACLOFEN 10 MG PO TABS
5.0000 mg | ORAL_TABLET | Freq: Three times a day (TID) | ORAL | Status: AC
  Administered 2019-05-03 – 2019-05-04 (×3): 5 mg via ORAL
  Filled 2019-05-03 (×3): qty 1

## 2019-05-03 MED ORDER — SODIUM CHLORIDE 1 G PO TABS
2.0000 g | ORAL_TABLET | Freq: Three times a day (TID) | ORAL | Status: DC
  Administered 2019-05-03: 2 g via ORAL
  Filled 2019-05-03 (×2): qty 2

## 2019-05-03 MED ORDER — ALUM & MAG HYDROXIDE-SIMETH 200-200-20 MG/5ML PO SUSP: 15.0000 mL | ORAL | Status: DC | PRN

## 2019-05-03 NOTE — Progress Notes (Signed)
Physical Therapy Treatment Patient Details Name: George Mullen MRN: [id number] DOB: 07-11-1960 Today's Date: 05/03/2019    History of Present Illness 59 y.o. 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  Patient had gone to oncology clinic today when he had his blood work which showed hyperkalemia.    PT Comments     The patient performed all of exercises on HEP, 10 repetitions, intermittent rest breaks. Patient requesting to rest and would then like to ambulate. Will check back as schedule allows.  Follow Up Recommendations  Home health PT     Equipment Recommendations  None recommended by PT    Recommendations for Other Services       Precautions / Restrictions Precautions Precaution Comments: new 3L O2 baseline from home; pleural catheter on  L    Mobility  Bed Mobility   Bed Mobility: Supine to Sit     Supine to sit: Min guard     General bed mobility comments: extra effort to push from left side  Transfers Overall transfer level: Needs assistance   Transfers: Sit to/from Stand Sit to Stand: Min guard         General transfer comment: practiced sit to stand x 5 without UE use.  Ambulation/Gait                 Stairs             Wheelchair Mobility    Modified Rankin (Stroke Patients Only)       Balance                                            Cognition Arousal/Alertness: Awake/alert                                            Exercises General Exercises - Upper Extremity Shoulder Flexion: AROM;10 reps;Supine Shoulder ABduction: AROM;Both;10 reps General Exercises - Lower Extremity Ankle Circles/Pumps: AROM;Both;10 reps;Supine Quad Sets: AROM;Both;10 reps;Supine Long Arc Quad: AROM;Both;10 reps;Seated Heel Slides: AROM;Both;10 reps;Supine Straight Leg Raises: AROM;AAROM;Both;10  reps;Supine Hip Flexion/Marching: AROM;Both;10 reps;Supine    General Comments        Pertinent Vitals/Pain Pain Assessment: Faces Faces Pain Scale: Hurts little more Pain Location: L side when exercising Pain Intervention(s): Monitored during session    Home Living                      Prior Function            PT Goals (current goals can now be found in the care plan section) Progress towards PT goals: Progressing toward goals    Frequency    Min 3X/week      PT Plan Current plan remains appropriate    Co-evaluation              AM-PAC PT "6 Clicks" Mobility   Outcome Measure  Help needed turning from your back to your side while in a flat bed without using bedrails?: None Help needed moving from lying on your back to sitting on the side of a flat bed without using bedrails?: A Little Help needed moving to and from a bed to a chair (including  a wheelchair)?: A Little Help needed standing up from a chair using your arms (e.g., wheelchair or bedside chair)?: A Little Help needed to walk in hospital room?: A Little Help needed climbing 3-5 steps with a railing? : A Little 6 Click Score: 19    End of Session Equipment Utilized During Treatment: Oxygen Activity Tolerance: Patient tolerated treatment well Patient left: in bed;with call bell/phone within reach;with family/visitor present Nurse Communication: Mobility status PT Visit Diagnosis: Difficulty in walking, not elsewhere classified (R26.2);Muscle weakness (generalized) (M62.81)     Time: 3419-3790 PT Time Calculation (min) (ACUTE ONLY): 30 min  Charges:  $Therapeutic Exercise: 23-37 mins                     Tresa Endo PT Acute Rehabilitation Services Pager [phone] Office [phone]    Claretha Cooper 05/03/2019, 1:09 PM

## 2019-05-03 NOTE — Progress Notes (Addendum)
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 Patient had gone to oncology clinic today when he had his blood work which showed hyperkalemia. EKG showed some T wave changes so patient was sent to the ED for further evaluation and treatment. Patient does have history of lung cancer and leftsided pigtail catheter for recurrent malignant pleural effusion. Patient states that he has been having some cough at home which is not unusual. He has some chest pain while he is coughing. He denies overt dyspnea but has been draining his Pleurx catheter at home. Complains of generalized fatigue,weakness intermittent nausea and episodes of vomiting. Does not have a good appetite. Has been moving bowels every couple of days. Denies any urinary urgency,frequency dysuria but has dark urine. Feels thirsty and has been drinking a lot of lemonade and water. Patient denies fever, chills or rigor. Denies sick contacts. Denies syncope but has lightheadedness and dizziness.  ED Course:In the ED, patient was noted to have potassium of 6.6 with sodium of 123. WBC was elevated at 26.5. Patient appeared to be mildly volume depleted. Patient received temporizing measures including insulin D50 and Lokelma in the ED. He was put on telemetry monitor and was then considered for observation in the hospital.  Assessment/Plan:  Principal Problem:   Hyperkalemia Active Problems:   Non-small cell cancer of left lung (HCC)   Malignant pleural effusion   Hyponatremia   Hyperkalemia with EKG changes.  Uncertain etiology so far, but could be excess lemonade/ steroids.   Potassium improved and has subsequently started to trend up.  Will check a  serum cortisol, serum osmolality.  Urine osmolality high and urine sodium was low.   Patient received temporizing measures in the ED including insulin D50 calcium gluconate.  EKG showed T wave elevation on presentation.  On telemetry monitor.  Received Lokelma x3 .  Might need to repeat Lokelma rising trend of potassium.  Stage IVb lung cancer with malignantpleural effusion with chronic hypoxic respiratory failure on nasal cannula oxygen status post Pleurx catheter. On oxygen by nasal cannula 3 L at home.  Being followed by Dr. Curt Bears.  Received chemotherapy for the first time in the clinic on 04/17/2019.  Hyponatremia.  Sodium of 121 today. Did not improve with the normal saline hydration.  Urinary sodium was less than 10 but urine osmolality was elevated.  We will get serum cortisol, serum osmolality stat.  Discontinue IV fluids.  There is possibility of SIADH.  Rule out adrenal insufficiency.  If his electrolytes do not improve we will get a nephrology evaluation.  Urinalysis did not show any evidence of protein.  Leukocytosis. No fever, clear sputum. Could be reactive.  Pleural fluid showed no organisms.  Urinalysis was negative.  Patient was receiving prednisone prior to coming to the hospital. Chest x-ray with near complete opacification of the left hemithorax with Pleurx catheter in place.  We will have the less threshold for antibiotic if persisting leukocytosis/spikes fever.  Check CBC in a.m.  Fatigue, generalized weakness, debility.   Patient was seen by physical therapy recommend home health PT on discharge.  VTE Prophylaxis: Lovenox subcu   Code Status: Full code  Family Communication: Spoke with the patient's wife yesterday  Disposition Plan:  . Patient is  from home . Likely disposition to home with home health in 2 to 3 days. . Barriers to discharge: Electrolyte imbalance including severe hyponatremia, rebound hyperkalemia, debility,  Addendum:  05/03/2019 3:49  PM  Serum cortisol normal. Serum osm low. 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 Will put on fluid restriction, add salt tab today. I spoke with the patient and the patient's wife and updated them about the plan. Family wishes to talk to Dr Julien Nordmann about the chemo plans. I paged Dr Julien Nordmann.   Consultants:  None  Procedures:  None  Antibiotics:  . None  Anti-infectives (From admission, onward)   None     Subjective: Today, patient was seen and examined at bedside.  Patient complains of fatigue and weakness with cough.  Has chest pain on coughing.  Objective: Vitals:   05/02/19 2055 05/03/19 0525  BP: 110/80 127/84  Pulse: 85 86  Resp:  16  Temp: 97.7 F (36.5 C) 97.6 F (36.4 C)  SpO2: 98% 97%    Intake/Output Summary (Last 24 hours) at 05/03/2019 1102 Last data filed at 05/03/2019 0600 Gross per 24 hour  Intake 240 ml  Output 2375 ml  Net -2135 ml   Filed Weights   04/27/2019 1836 05/02/19 0517 05/03/19 0500  Weight: 65 kg 65.4 kg 64.7 kg   Body mass index is 21.69 kg/m.   Physical Exam: GENERAL: Patient is alert awake and oriented. Not in obvious distress.  On nasal cannula 3 L/min.  Thinly built. HENT: No scleral pallor or icterus. Pupils equally reactive to light. Oral mucosa is moist NECK: is supple, no gross swelling noted. CHEST: Diminished breath sounds on the left side.  Left-sided Pleurx  catheter in place. CVS: S1 and S2 heard, no murmur. Regular rate and rhythm.  ABDOMEN: Soft, non-tender, bowel sounds are present. EXTREMITIES: No edema. CNS: Cranial nerves are intact. No focal motor deficits. SKIN: warm and dry without rashes.  Data Review: I have personally reviewed the following laboratory data and studies,  CBC: Recent Labs  Lab 04/12/2019 1118 04/16/2019 1534 05/02/19 0238  WBC 24.6* 26.5* 28.5*  NEUTROABS 22.4* 25.4*  --   HGB 16.9 16.2 14.5  HCT 49.9 48.6 42.7  MCV 86.3 89.2 88.6  PLT 477* 504* 451*     Basic Metabolic Panel: Recent Labs  Lab 04/17/2019 1534 05/02/2019 1932 05/02/19 0238 05/02/19 1255 05/03/19 0758  NA 123* 123* 125* 123* 121*  K 6.6* 5.9* 5.8* 4.8 5.2*  CL 88* 88* 89* 90* 90*  CO2 25 24 24 24 23   GLUCOSE 127* 148* 128* 193* 120*  BUN 25* 27* 25* 23* 23*  CREATININE 1.15 1.17 0.96 0.93 0.94  CALCIUM 8.2* 8.2* 8.0* 7.5* 7.7*  PHOS 4.1  --   --   --   --    Liver Function Tests: Recent Labs  Lab 04/09/2019 1118 04/11/2019 1534 05/02/19 0238  AST 20 22 23   ALT 68* 58* 49*  ALKPHOS 179* 132* 108  BILITOT 0.5 0.8 0.7  PROT 5.6* 5.8* 5.3*  ALBUMIN 2.3* 2.4* 2.2*   No results for input(s): LIPASE, AMYLASE in the last 168 hours. No results for input(s): AMMONIA in the last 168 hours. Cardiac Enzymes: No results for input(s): CKTOTAL, CKMB, CKMBINDEX, TROPONINI in the last 168 hours. BNP (last 3 results) No results for input(s): BNP in the last 8760 hours.  ProBNP (last 3 results) No results for input(s): PROBNP in the last 8760 hours.  CBG: Recent  Labs  Lab 04/10/2019 1739  GLUCAP 133*   Recent Results (from the past 240 hour(s))  SARS CORONAVIRUS 2 (TAT 6-24 HRS) Nasopharyngeal Nasopharyngeal Swab     Status: None   Collection Time: 04/11/2019  3:43 PM   Specimen: Nasopharyngeal Swab  Result Value Ref Range Status   SARS Coronavirus 2 NEGATIVE NEGATIVE Final    Comment: (NOTE) SARS-CoV-2 target nucleic acids are NOT DETECTED. The SARS-CoV-2 RNA is generally detectable in upper and lower respiratory specimens during the acute phase of infection. Negative results do not preclude SARS-CoV-2 infection, do not rule out co-infections with other pathogens, and should not be used as the sole basis for treatment or other patient management decisions. Negative results must be combined with clinical observations, patient history, and epidemiological information. The expected result is Negative. Fact Sheet for  Patients: SugarRoll.be Fact Sheet for Healthcare Providers: https://www.woods-mathews.com/ This test is not yet approved or cleared by the [place] FDA and  has been authorized for detection and/or diagnosis of SARS-CoV-2 by FDA under an Emergency Use Authorization (EUA). This EUA will remain  in effect (meaning this test can be used) for the duration of the COVID-19 declaration under Section 56 4(b)(1) of the Act, 21 U.S.C. section 360bbb-3(b)(1), unless the authorization is terminated or revoked sooner. Performed at [facility], [place] [address], [place], [place] [address]   Gram stain     Status: None   Collection Time: 05/02/19  4:42 PM   Specimen: Pleura  Result Value Ref Range Status   Specimen Description PLEURAL  Final   Special Requests LEFT DRAINAGE  Final   Gram Stain   Final    FEW WBC PRESENT, PREDOMINANTLY MONONUCLEAR NO ORGANISMS SEEN Performed at [facility], [place] [address] [place], [place] [address]    Report Status 05/02/2019 FINAL  Final     Studies: DG Chest Port 1 View  Result Date: 04/22/2019 CLINICAL DATA:  Pleural effusion. EXAM: PORTABLE CHEST 1 VIEW COMPARISON:  04/23/2019 FINDINGS: There is near complete opacification of the left hemithorax which has progressed since the prior study. A left-sided PleurX catheter is in place. The heart size is poorly evaluated. There is no obvious pneumothorax. No convincing right-sided pleural effusion. Left sided volume loss is suspected given the lack of significant mediastinal shift. IMPRESSION: Near complete opacification of the left hemithorax consistent with a large left-sided pleural effusion. A left-sided PleurX catheter is noted. Electronically Signed   By: Constance Holster M.D.   On: 04/10/2019 20:56      Flora Lipps, MD  [facility] 05/03/2019

## 2019-05-04 ENCOUNTER — Encounter (HOSPITAL_COMMUNITY): Payer: Self-pay | Admitting: Internal Medicine

## 2019-05-04 ENCOUNTER — Other Ambulatory Visit: Payer: Self-pay

## 2019-05-04 LAB — CBC
HCT: 45.4 % (ref 39.0–52.0)
Hemoglobin: 15.2 g/dL (ref 13.0–17.0)
MCH: 30 pg (ref 26.0–34.0)
MCHC: 33.5 g/dL (ref 30.0–36.0)
MCV: 89.7 fL (ref 80.0–100.0)
Platelets: 504 10*3/uL — ABNORMAL HIGH (ref 150–400)
RBC: 5.06 MIL/uL (ref 4.22–5.81)
RDW: 12.9 % (ref 11.5–15.5)
WBC: 24.7 10*3/uL — ABNORMAL HIGH (ref 4.0–10.5)
nRBC: 0 % (ref 0.0–0.2)

## 2019-05-04 LAB — BASIC METABOLIC PANEL
Anion gap: 11 (ref 5–15)
Anion gap: 9 (ref 5–15)
Anion gap: 10 (ref 5–15)
BUN: 28 mg/dL — ABNORMAL HIGH (ref 6–20)
BUN: 28 mg/dL — ABNORMAL HIGH (ref 6–20)
BUN: 28 mg/dL — ABNORMAL HIGH (ref 6–20)
CO2: 24 mmol/L (ref 22–32)
CO2: 23 mmol/L (ref 22–32)
CO2: 20 mmol/L — ABNORMAL LOW (ref 22–32)
Calcium: 8.3 mg/dL — ABNORMAL LOW (ref 8.9–10.3)
Calcium: 7.9 mg/dL — ABNORMAL LOW (ref 8.9–10.3)
Calcium: 7.6 mg/dL — ABNORMAL LOW (ref 8.9–10.3)
Chloride: 87 mmol/L — ABNORMAL LOW (ref 98–111)
Chloride: 88 mmol/L — ABNORMAL LOW (ref 98–111)
Chloride: 86 mmol/L — ABNORMAL LOW (ref 98–111)
Creatinine, Ser: 1.17 mg/dL (ref 0.61–1.24)
Creatinine, Ser: 1.09 mg/dL (ref 0.61–1.24)
Creatinine, Ser: 1.12 mg/dL (ref 0.61–1.24)
GFR calc Af Amer: >60 mL/min (ref >60)
GFR calc Af Amer: >60 mL/min (ref >60)
GFR calc Af Amer: >60 mL/min (ref >60)
GFR calc non Af Amer: >60 mL/min (ref >60)
GFR calc non Af Amer: >60 mL/min (ref >60)
GFR calc non Af Amer: >60 mL/min (ref >60)
Glucose, Bld: 121 mg/dL — ABNORMAL HIGH (ref 70–99)
Glucose, Bld: 136 mg/dL — ABNORMAL HIGH (ref 70–99)
Glucose, Bld: 133 mg/dL — ABNORMAL HIGH (ref 70–99)
Potassium: 6.4 mmol/L (ref 3.5–5.1)
Potassium: 5.7 mmol/L — ABNORMAL HIGH (ref 3.5–5.1)
Potassium: 5.4 mmol/L — ABNORMAL HIGH (ref 3.5–5.1)
Sodium: 122 mmol/L — ABNORMAL LOW (ref 135–145)
Sodium: 120 mmol/L — ABNORMAL LOW (ref 135–145)
Sodium: 116 mmol/L — CL (ref 135–145)

## 2019-05-04 LAB — MAGNESIUM: Magnesium: 2.8 mg/dL — ABNORMAL HIGH (ref 1.7–2.4)

## 2019-05-04 MED ORDER — SODIUM CHLORIDE 1 G PO TABS: 1.0000 g | ORAL_TABLET | Freq: Three times a day (TID) | ORAL | Status: DC

## 2019-05-04 MED ORDER — SODIUM CHLORIDE 0.9 % IV SOLN: INTRAVENOUS | Status: DC

## 2019-05-04 MED ORDER — BACLOFEN 10 MG PO TABS
10.0000 mg | ORAL_TABLET | Freq: Three times a day (TID) | ORAL | Status: DC
  Administered 2019-05-04 – 2019-05-05 (×2): 10 mg via ORAL
  Filled 2019-05-04 (×2): qty 1

## 2019-05-04 MED ORDER — LORAZEPAM 2 MG/ML IJ SOLN
1.0000 mg | Freq: Once | INTRAMUSCULAR | Status: AC
  Administered 2019-05-04: 1 mg via INTRAVENOUS
  Filled 2019-05-04: qty 1

## 2019-05-04 MED ORDER — SODIUM ZIRCONIUM CYCLOSILICATE 10 G PO PACK
10.0000 g | PACK | Freq: Three times a day (TID) | ORAL | Status: DC
  Administered 2019-05-04 – 2019-05-06 (×4): 10 g via ORAL
  Filled 2019-05-04 (×8): qty 1

## 2019-05-04 MED ORDER — SODIUM ZIRCONIUM CYCLOSILICATE 10 G PO PACK
10.0000 g | PACK | Freq: Three times a day (TID) | ORAL | Status: DC
  Administered 2019-05-04: 10 g via ORAL
  Filled 2019-05-04 (×3): qty 1

## 2019-05-04 MED ORDER — CALCIUM GLUCONATE-NACL 1-0.675 GM/50ML-% IV SOLN
1.0000 g | Freq: Once | INTRAVENOUS | Status: AC
  Administered 2019-05-04: 1000 mg via INTRAVENOUS
  Filled 2019-05-04: qty 50

## 2019-05-04 NOTE — Progress Notes (Signed)
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 Patient had gone to oncology clinic today when he had his blood work which showed hyperkalemia. EKG showed some T wave changes so patient was sent to the ED for further evaluation and treatment. Patient does have history of lung cancer and leftsided pigtail catheter for recurrent malignant pleural effusion. Patient states that he has been having some cough at home which is not unusual. He has some chest pain while he is coughing. He denies overt dyspnea but has been draining his Pleurx catheter at home. Complains of generalized fatigue,weakness intermittent nausea and episodes of vomiting. Does not have a good appetite. Has been moving bowels every couple of days. Denies any urinary urgency,frequency dysuria but has dark urine. Feels thirsty and has been drinking a lot of lemonade and water. Patient denies fever, chills or rigor. Denies sick contacts. Denies syncope but has lightheadedness and dizziness.  ED Course:In the ED, patient was noted to have potassium of 6.6 with sodium of 123. WBC was elevated at 26.5. Patient appeared to be mildly volume depleted. Patient received temporizing measures including insulin D50 and Lokelma in the ED. He was put on telemetry monitor and was then considered for observation in the hospital.  Assessment/Plan:  Principal Problem:   Hyperkalemia Active Problems:   Non-small cell cancer of left lung (HCC)   Malignant pleural effusion   Hyponatremia   Hyperkalemia with EKG changes.  On presentation. Uncertain etiology so far, patient stated that he was drinking lemonade/was on prednisone at home.  Potassium improved initially  with Lokelma but has significantly gone up today.  Spoke with nephrology Dr. Jonnie Finner regarding the patient.  Normal serum cortisol, low serum osmolality.  Urine osmolality high and urine sodium was low.   Patient received temporizing measures in the ED including insulin D50 calcium gluconate.  EKG showed T wave elevation on presentation.  On telemetry monitor.  Received Lokelma x3 .  Will consider Lokelma again today.  Will give 1 dose of calcium gluconate.  Check EKG.  Unlikely to be tumor lysis syndrome due to normal phosphate and uric acid.  Stage IVb lung cancer with malignantpleural effusion with chronic hypoxic respiratory failure on nasal cannula oxygen status post Pleurx catheter. On oxygen by nasal cannula 3 L at home.  Being followed by Dr. Curt Bears.  Received chemotherapy for the first time in the clinic on 04/22/2019.  Hyponatremia.  Sodium of 122 today. Did not improve with initial normal saline hydration initially.  He was then put on fluid restriction and salt tablets.  Patient did not like the salt tablets.  Urinary sodium was less than 10 but urine osmolality was elevated.  Will get nephrology evaluation.   Leukocytosis. No fever, clear sputum. Could be reactive.  Pleural fluid showed no organisms.  Urinalysis was negative.  Patient was receiving prednisone prior to coming to the hospital. Chest x-ray with near complete opacification of the left hemithorax with Pleurx catheter in place.  We will have the less threshold for antibiotic if persisting leukocytosis/spikes fever.  Leukocytosis has plateaued.  Check CBC in a.m.  Fatigue, generalized weakness, debility.   Patient was seen by physical therapy recommend home health PT on discharge.  VTE Prophylaxis: Lovenox subcu  Code Status: Full code  Family Communication: Spoke with the patient's wife today and updated her about the clinical condition of the patient.  Updated about the nephrology evaluation.  Disposition  Plan:  . Patient is from home . Likely disposition to home with home health in 2 to 3 days. . Barriers to discharge: Electrolyte imbalance including severe hyponatremia, rebound hyperkalemia, debility,  Consultants:  Nephrology  Procedures:  None  Antibiotics:  . None  Anti-infectives (From admission, onward)   None     Subjective: Today, patient was seen and examined at bedside.  Complains of fatigue and mild chest pain on coughing.  Objective: Vitals:   05/03/19 2017 05/04/19 0616  BP: 112/81 104/70  Pulse: 100 (!) 102  Resp: 20 20  Temp: 98.1 F (36.7 C) 98.2 F (36.8 C)  SpO2: 92% 96%    Intake/Output Summary (Last 24 hours) at 05/04/2019 1012 Last data filed at 05/04/2019 0200 Gross per 24 hour  Intake 120 ml  Output 800 ml  Net -680 ml   Filed Weights   05/02/19 0517 05/03/19 0500 05/04/19 0621  Weight: 65.4 kg 64.7 kg 65.1 kg   Body mass index is 21.82 kg/m.   Physical Exam: GENERAL: Patient is alert awake and oriented. Not in obvious distress.  On nasal cannula   Thinly built. HENT: No scleral pallor or icterus. Pupils equally reactive to light. Oral mucosa is moist NECK: is supple, no gross swelling noted. CHEST: Diminished breath sounds on the left side.  Left-sided Pleurx  catheter in place. CVS: S1 and S2 heard, no murmur. Regular rate and rhythm.  ABDOMEN: Soft, non-tender, bowel sounds are present. EXTREMITIES: No edema. CNS: Cranial nerves are intact. No focal motor deficits. SKIN: warm and dry without rashes.  Data Review: I have personally reviewed the following laboratory data and studies,  CBC: Recent Labs  Lab 04/29/2019 1118 04/30/2019 1534 05/02/19 0238 05/03/19 1133 05/04/19 0813  WBC 24.6* 26.5* 28.5* 25.9* 24.7*  NEUTROABS 22.4* 25.4*  --   --   --   HGB 16.9 16.2 14.5 14.5 15.2  HCT 49.9 48.6 42.7 42.8 45.4  MCV 86.3 89.2 88.6 88.6 89.7  PLT 477* 504* 451* 478* 703*   Basic Metabolic Panel: Recent Labs  Lab  04/09/2019 1534 04/15/2019 1534 04/11/2019 1932 05/02/19 0238 05/02/19 1255 05/03/19 0758 05/04/19 0813  NA 123*   < > 123* 125* 123* 121* 122*  K 6.6*   < > 5.9* 5.8* 4.8 5.2* 6.4*  CL 88*   < > 88* 89* 90* 90* 87*  CO2 25   < > 24 24 24 23 24   GLUCOSE 127*   < > 148* 128* 193* 120* 121*  BUN 25*   < > 27* 25* 23* 23* 28*  CREATININE 1.15   < > 1.17 0.96 0.93 0.94 1.17  CALCIUM 8.2*   < > 8.2* 8.0* 7.5* 7.7* 8.3*  MG  --   --   --   --   --   --  2.8*  PHOS 4.1  --   --   --   --   --   --    < > = values in this interval not displayed.   Liver Function Tests: Recent Labs  Lab 04/29/2019 1118 04/15/2019 1534 05/02/19 0238  AST 20 22 23   ALT 68* 58* 49*  ALKPHOS 179* 132* 108  BILITOT 0.5 0.8 0.7  PROT 5.6* 5.8* 5.3*  ALBUMIN 2.3* 2.4* 2.2*   No  results for input(s): LIPASE, AMYLASE in the last 168 hours. No results for input(s): AMMONIA in the last 168 hours. Cardiac Enzymes: No results for input(s): CKTOTAL, CKMB, CKMBINDEX, TROPONINI in the last 168 hours. BNP (last 3 results) No results for input(s): BNP in the last 8760 hours.  ProBNP (last 3 results) No results for input(s): PROBNP in the last 8760 hours.  CBG: Recent Labs  Lab 05/03/2019 1739  GLUCAP 133*   Recent Results (from the past 240 hour(s))  SARS CORONAVIRUS 2 (TAT 6-24 HRS) Nasopharyngeal Nasopharyngeal Swab     Status: None   Collection Time: 04/21/2019  3:43 PM   Specimen: Nasopharyngeal Swab  Result Value Ref Range Status   SARS Coronavirus 2 NEGATIVE NEGATIVE Final    Comment: (NOTE) SARS-CoV-2 target nucleic acids are NOT DETECTED. The SARS-CoV-2 RNA is generally detectable in upper and lower respiratory specimens during the acute phase of infection. Negative results do not preclude SARS-CoV-2 infection, do not rule out co-infections with other pathogens, and should not be used as the sole basis for treatment or other patient management decisions. Negative results must be combined with clinical  observations, patient history, and epidemiological information. The expected result is Negative. Fact Sheet for Patients: SugarRoll.be Fact Sheet for Healthcare Providers: https://www.woods-mathews.com/ This test is not yet approved or cleared by the [place] FDA and  has been authorized for detection and/or diagnosis of SARS-CoV-2 by FDA under an Emergency Use Authorization (EUA). This EUA will remain  in effect (meaning this test can be used) for the duration of the COVID-19 declaration under Section 56 4(b)(1) of the Act, 21 U.S.C. section 360bbb-3(b)(1), unless the authorization is terminated or revoked sooner. Performed at [facility], [place] [address] [place], [place] [address]   Culture, body fluid-bottle     Status: None (Preliminary result)   Collection Time: 05/02/19  4:42 PM   Specimen: Pleura  Result Value Ref Range Status   Specimen Description PLEURAL  Final   Special Requests LEFT DRAINAGE  Final   Culture   Final    NO GROWTH 2 DAYS Performed at [place] [address] [place], [place] [address]    Report Status PENDING  Incomplete  Gram stain     Status: None   Collection Time: 05/02/19  4:42 PM   Specimen: Pleura  Result Value Ref Range Status   Specimen Description PLEURAL  Final   Special Requests LEFT DRAINAGE  Final   Gram Stain   Final    FEW WBC PRESENT, PREDOMINANTLY MONONUCLEAR NO ORGANISMS SEEN Performed at [facility], [address] [place], [place] [address]    Report Status 05/02/2019 FINAL  Final     Studies: No results found.    Flora Lipps, MD  [facility] 05/04/2019

## 2019-05-04 NOTE — Consult Note (Signed)
Renal Service Consult Note [facility]  George Mullen 05/04/2019 George Mullen Requesting Physician:  Dr   Reason for Consult:  Hyperkalemia, hypoNa+ HPI: The patient is a 59 y.o. year-old who has stage IVB non-small cell lung cancer recently diagnosed, he presented w/ large LUL lung mass, hilar/ mediastinal LAN, lul/ rul lung nodules and bony mets R iliac.  Started chemoRx (carboplatin/ alimta/ Keytruda) 3d ago on 2/24 but only got 1 of 3 agents , not sure which one given, before labs returned showing George Mullen and pt sent to hospital.  Admit K+ was 6.7 which improved to 4.8 but now back up to 6.4 today. Also hyponatremia w/ Na+ 126 > 123, started on 0.9% saline, Na+ went up to 125 then down to 123 so saline was dc'd, next am 2/26 Na 121 and today 122 on fluid restriction.  Creat stable 0.9- 1.1, asked to see for hypoNa+ and higk K+.    Patient denies hx of kidney disease / high K+ in the past. Works for [employer] in maintenance.  Married, wife is here today.    Pt has some vomiting at home prior to admission, no diarrhea. No nsaids.  Was drinking "a lot " of water prior to admit due to thirst.    ROS  denies CP  no joint pain   no HA  no blurry vision  no rash  no diarrhea  no nausea/ vomiting  no dysuria  no difficulty voiding  no change in urine color    Past Medical History  Past Medical History:  Diagnosis Date  . Cancer ([place])    lung cancer  . Dyspnea    with exertion  . PONV (postoperative nausea and vomiting)    Past Surgical History  Past Surgical History:  Procedure Laterality Date  . APPENDECTOMY    . BRONCHIAL NEEDLE ASPIRATION BIOPSY  04/02/2019   Procedure: BRONCHIAL NEEDLE ASPIRATION BIOPSIES;  Surgeon: Candee Furbish, MD;  Location: [facility] ENDOSCOPY;  Service: Endoscopy;;  . ENDOBRONCHIAL ULTRASOUND  04/02/2019   Procedure: ENDOBRONCHIAL ULTRASOUND;  Surgeon: Candee Furbish, MD;  Location: [place];  Service: Endoscopy;;  . HIP  SURGERY Right   . INGUINAL HERNIA REPAIR    . SHOULDER SURGERY Right   . VIDEO BRONCHOSCOPY N/A 04/02/2019   Procedure: VIDEO BRONCHOSCOPY WITHOUT FLUORO;  Surgeon: Candee Furbish, MD;  Location: [facility] ENDOSCOPY;  Service: Endoscopy;  Laterality: N/A;  . WISDOM TOOTH EXTRACTION     Family History  Family History  Problem Relation Age of Onset  . COPD Mother   . Lung cancer Maternal Grandmother   . Stomach cancer Paternal Grandmother    Social History  reports that he quit smoking about 6 weeks ago. His smoking use included cigarettes. He has a 67.50 pack-year smoking history. He has never used smokeless tobacco. He reports current alcohol use. He reports that he does not use drugs. Allergies No Known Allergies Home medications Prior to Admission medications   Medication Sig Start Date End Date Taking? Authorizing Provider  albuterol (PROVENTIL) (2.5 MG/3ML) 0.083% nebulizer solution Take 3 mLs (2.5 mg total) by nebulization every 6 (six) hours as needed for wheezing or shortness of breath. 04/02/19  Yes Candee Furbish, MD  benzonatate (TESSALON) 200 MG capsule Take 1 capsule (200 mg total) by mouth 3 (three) times daily as needed for cough. 04/04/19  Yes Lauraine Rinne, NP  docusate sodium (COLACE) 100 MG capsule Take 100 mg by mouth 2 (two)  times daily.   Yes [provider]  folic acid (FOLVITE) 1 MG tablet Take 1 tablet (1 mg total) by mouth daily. 04/18/19  Yes Curt Bears, MD  ibuprofen (ADVIL) 200 MG tablet Take 600 mg by mouth every 6 (six) hours as needed for fever, headache or moderate pain.   Yes [provider]  Multiple Vitamin (MULTIVITAMIN WITH MINERALS) TABS tablet Take 1 tablet by mouth daily.   Yes [provider]  ondansetron (ZOFRAN ODT) 4 MG disintegrating tablet Take 1 tablet (4 mg total) by mouth every 8 (eight) hours as needed for nausea or vomiting. 04/11/19  Yes Albrizze, Kaitlyn E, PA-C  oxyCODONE 10 MG TABS Take 1 tablet (10 mg total) by  mouth every 4 (four) hours as needed for moderate pain. 04/23/19  Yes Donne Hazel, MD  oxyCODONE-acetaminophen (PERCOCET/ROXICET) 5-325 MG tablet Take 1 tablet by mouth every 6 (six) hours as needed for severe pain.   Yes [provider]  triamcinolone cream (KENALOG) 0.1 % Apply 1 application topically 2 (two) times daily. 04/24/19  Yes Candee Furbish, MD  acetaminophen (TYLENOL) 500 MG tablet Take 1 tablet (500 mg total) by mouth 3 (three) times daily. Patient not taking: Reported on 04/08/2019 04/23/19   Donne Hazel, MD  fentaNYL (DURAGESIC) 25 MCG/HR Place 1 patch onto the skin every 3 (three) days. 04/11/2019   Curt Bears, MD  fluticasone furoate-vilanterol (BREO ELLIPTA) 100-25 MCG/INH AEPB Inhale 1 puff into the lungs daily. Patient not taking: Reported on 04/20/2019 03/27/19   Candee Furbish, MD  HYDROcodone-homatropine [facility]) 5-1.5 MG/5ML syrup Take 5 mLs by mouth every 6 (six) hours as needed for cough. Patient not taking: Reported on 04/21/2019 04/02/19   Lauraine Rinne, NP     Vitals:   05/03/19 1514 05/03/19 2017 05/04/19 0616 05/04/19 0621  BP: 119/74 112/81 104/70   Pulse: 94 100 (!) 102   Resp:  20 20   Temp: 98.6 F (37 C) 98.1 F (36.7 C) 98.2 F (36.8 C)   TempSrc: Oral Oral Oral   SpO2: 100% 92% 96%   Weight:    65.1 kg  Height:       Exam Gen alert, no distress, calm, drowsy from meds No rash, cyanosis or gangrene Sclera anicteric, throat clear slihghtly dry  No jvd or bruits, flat neck veins Chest clear bilat on R, dec'd 1/2 up on L RRR no MRG Abd soft ntnd no mass or ascites +bs GU normal male MS no joint effusions or deformity Ext trace + pretib edema, no wounds or ulcers Neuro is alert, Ox 3 , nf    Home meds:  - ibuprofen prn/ oxycodone prn/ fentanyl 25 ug /hr/ prn hydrocodone  - prn's/ vitamins/ supplements     UA 2/25 > turbid, prot neg, 1.024    UNa < 10,  Uosm 784 done on 05/02/19    Assessment/ Plan: 1. Hyponatremia -  not sure hypovolemia vs SIADH, Uosm / UNa more c/w vol depletion but mixed response to saline 1st time. Will resume saline and get f/u labs and if not improving will consider tolvaptan next.  Hold salt tabs for now. Will follow.  2. Hyperkalemia - unclear cause, tumor lysis not likely w/ normal uric acid and phos. Poss vol depletion. Trial IVF's and repeat.  Lokelma and renal diet as well.   3. Lung Ca - stage IVB, just started chemoRx on 2/24, got 1 of 3 meds.  Rob Javi Bollman  MD 05/04/2019, 1:04 PM  Recent Labs  Lab 05/03/19 1133 05/04/19 0813  WBC 25.9* 24.7*  HGB 14.5 15.2   Recent Labs  Lab 04/10/2019 1118 04/10/2019 1356 05/04/2019 1534 04/14/2019 1534 04/23/2019 1932 04/12/2019 1932 05/02/19 0238 05/02/19 0238 05/02/19 1255 05/02/19 1255 05/03/19 0758 05/04/19 0813  K 6.4*   < > 6.6*   < > 5.9*   < > 5.8*   < > 4.8   < > 5.2* 6.4*  BUN 24*   < > 25*   < > 27*   < > 25*   < > 23*   < > 23* 28*  CREATININE 1.05  --  1.15   < > 1.17   < > 0.96   < > 0.93   < > 0.94 1.17  CALCIUM 8.6*  --  8.2*  --  8.2*  --  8.0*  --  7.5*  --  7.7* 8.3*  PHOS  --   --  4.1  --   --   --   --   --   --   --   --   --    < > = values in this interval not displayed.

## 2019-05-05 LAB — CBC
HCT: 40.9 % (ref 39.0–52.0)
Hemoglobin: 13.7 g/dL (ref 13.0–17.0)
MCH: 29.7 pg (ref 26.0–34.0)
MCHC: 33.5 g/dL (ref 30.0–36.0)
MCV: 88.7 fL (ref 80.0–100.0)
Platelets: 465 10*3/uL — ABNORMAL HIGH (ref 150–400)
RBC: 4.61 MIL/uL (ref 4.22–5.81)
RDW: 12.8 % (ref 11.5–15.5)
WBC: 24.9 10*3/uL — ABNORMAL HIGH (ref 4.0–10.5)
nRBC: 0 % (ref 0.0–0.2)

## 2019-05-05 LAB — BASIC METABOLIC PANEL
Anion gap: 9 (ref 5–15)
Anion gap: 11 (ref 5–15)
BUN: 27 mg/dL — ABNORMAL HIGH (ref 6–20)
BUN: 29 mg/dL — ABNORMAL HIGH (ref 6–20)
CO2: 21 mmol/L — ABNORMAL LOW (ref 22–32)
CO2: 20 mmol/L — ABNORMAL LOW (ref 22–32)
Calcium: 7.7 mg/dL — ABNORMAL LOW (ref 8.9–10.3)
Calcium: 7.8 mg/dL — ABNORMAL LOW (ref 8.9–10.3)
Chloride: 90 mmol/L — ABNORMAL LOW (ref 98–111)
Chloride: 89 mmol/L — ABNORMAL LOW (ref 98–111)
Creatinine, Ser: 1.15 mg/dL (ref 0.61–1.24)
Creatinine, Ser: 1.14 mg/dL (ref 0.61–1.24)
GFR calc Af Amer: >60 mL/min (ref >60)
GFR calc Af Amer: >60 mL/min (ref >60)
GFR calc non Af Amer: >60 mL/min (ref >60)
GFR calc non Af Amer: >60 mL/min (ref >60)
Glucose, Bld: 118 mg/dL — ABNORMAL HIGH (ref 70–99)
Glucose, Bld: 114 mg/dL — ABNORMAL HIGH (ref 70–99)
Potassium: 5.9 mmol/L — ABNORMAL HIGH (ref 3.5–5.1)
Potassium: 6.1 mmol/L — ABNORMAL HIGH (ref 3.5–5.1)
Sodium: 120 mmol/L — ABNORMAL LOW (ref 135–145)
Sodium: 120 mmol/L — ABNORMAL LOW (ref 135–145)

## 2019-05-05 LAB — CREATININE, URINE, RANDOM: Creatinine, Urine: 190.26 mg/dL

## 2019-05-05 LAB — MAGNESIUM: Magnesium: 2.6 mg/dL — ABNORMAL HIGH (ref 1.7–2.4)

## 2019-05-05 LAB — SODIUM, URINE, RANDOM: Sodium, Ur: <10 mmol/L

## 2019-05-05 LAB — SODIUM: Sodium: 116 mmol/L — CL (ref 135–145)

## 2019-05-05 MED ORDER — BACLOFEN 10 MG PO TABS
5.0000 mg | ORAL_TABLET | Freq: Three times a day (TID) | ORAL | Status: DC
  Administered 2019-05-05: 5 mg via ORAL
  Filled 2019-05-05 (×2): qty 1

## 2019-05-05 MED ORDER — TOLVAPTAN 15 MG PO TABS
15.0000 mg | ORAL_TABLET | ORAL | Status: AC
  Administered 2019-05-05: 15 mg via ORAL
  Filled 2019-05-05: qty 1

## 2019-05-05 MED ORDER — FUROSEMIDE 10 MG/ML IJ SOLN
20.0000 mg | Freq: Once | INTRAMUSCULAR | Status: DC
  Filled 2019-05-05: qty 2

## 2019-05-05 MED ORDER — HYDROCORTISONE NA SUCCINATE PF 100 MG IJ SOLR
50.0000 mg | Freq: Four times a day (QID) | INTRAMUSCULAR | Status: DC
  Administered 2019-05-05 – 2019-05-07 (×9): 50 mg via INTRAVENOUS
  Filled 2019-05-05 (×9): qty 2

## 2019-05-05 MED ORDER — SODIUM CHLORIDE 3 % IV SOLN
INTRAVENOUS | Status: DC
  Administered 2019-05-05: 28 mL/h via INTRAVENOUS
  Filled 2019-05-05 (×2): qty 500

## 2019-05-05 MED ORDER — OXYCODONE HCL 5 MG PO TABS
10.0000 mg | ORAL_TABLET | ORAL | Status: DC | PRN
  Administered 2019-05-05 – 2019-05-06 (×3): 10 mg via ORAL
  Filled 2019-05-05 (×3): qty 2

## 2019-05-05 NOTE — Progress Notes (Signed)
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 Patient had gone to oncology clinic today when he had his blood work which showed hyperkalemia. EKG showed some T wave changes so patient was sent to the ED for further evaluation and treatment. Patient does have history of lung cancer and leftsided pigtail catheter for recurrent malignant pleural effusion. ED Course:In the ED, patient was noted to have potassium of 6.6 with sodium of 123. WBC was elevated at 26.5. Patient appeared to be mildly volume depleted. Patient received temporizing measures including insulin D50 and Lokelma in the ED. He was put on telemetry monitor and was then admitted to the hospital for further evaluation and treatment.   Assessment/Plan:  Principal Problem:   Hyperkalemia Active Problems:   Non-small cell cancer of left lung (HCC)   Malignant pleural effusion   Hyponatremia   Hyperkalemia with T wave changes on admission .  On presentation. Uncertain etiology so far.receiving Lokelma but still with persistent hyperkalemia. Nephrology on board. Will follow recommendation.  Normal serum cortisol, low serum osmolality.  Urine osmolality high and urine sodium was low.   Repeat urinary labs have been sent today. Patient is received IV fluids yesterday with no significant improvement in the sodium levels. He will be given Samsca x1 dose today. Continue telemetry monitor.  Unlikely to be tumor lysis syndrome due to normal phosphate and uric acid. Latest potassium of 6.1 today. Continue Lokelma.  Somnolence today.  Likely from medication.  Received Ativan yesterday.  Will hold off with sedatives and narcotics today -gradually introduce as his mentation  improves.  Stage IVb lung cancer with malignantpleural effusion with chronic hypoxic respiratory failure on nasal cannula oxygen status post Pleurx catheter. On oxygen by nasal cannula 3 L at home.  Being followed by Dr. Curt Bears.  Received chemotherapy-one of the infusions, for the first time in the clinic on 04/29/2019. Unlikely to be tumor lysis syndrome at this time.  Hyponatremia.  Latest sodium of 120. Did not improve withl normal saline. Nephrology on board. Laurita Quint will be tried today.  Leukocytosis. Could be secondary to malignancy. WBC of 24.9 today. No fever.. Could be reactive.  Pleural fluid showed no organisms.  Urinalysis was negative.  Patient was receiving prednisone prior to coming to the hospital. Chest x-ray with near complete opacification of the left hemithorax with Pleurx catheter in place.  Leukocytosis has plateaued. Continue to monitor leukocytes.  Fatigue, generalized weakness, debility.   Patient was seen by physical therapy recommend home health PT on discharge. Decrease baclofen today.  VTE Prophylaxis: Lovenox subcu   Code Status: Full code  Family Communication:  I again spoke with the patient's wife on the phone and updated him about the clinical condition of the patient.  Patient was somnolent this morning because he had received Ativan yesterday.  Patient's wife is upset about it.  I have explained the need we will de-escalate the sedation today and see how he does by tomorrow.  Informed nursing staff about avoiding Ativan for now.  Disposition Plan:  . Patient is from home . Likely disposition to home with home health in 2 to 3 days. . Barriers to discharge: Electrolyte imbalance including severe hyponatremia, rebound hyperkalemia, debility, somnolence  Consultants:  Nephrology  Procedures:  None  Antibiotics:  . None  Anti-infectives (From admission, onward)   None     Subjective:  Today, patient was seen and examined at  bedside. Complains of fatigue and weakness. Feels very sleepy today. Has some chest pain on coughing. Had some hiccups yesterday.  Objective: Vitals:   05/04/19 2020 05/05/19 0549  BP: 104/84 124/87  Pulse: 100 (!) 105  Resp: 18 18  Temp: 98.9 F (37.2 C) 99.7 F (37.6 C)  SpO2: 95% 100%    Intake/Output Summary (Last 24 hours) at 05/05/2019 1039 Last data filed at 05/05/2019 1000 Gross per 24 hour  Intake 530.38 ml  Output 600 ml  Net -69.62 ml   Filed Weights   05/03/19 0500 05/04/19 0621 05/05/19 0500  Weight: 64.7 kg 65.1 kg 67.4 kg   Body mass index is 22.59 kg/m.   Physical Exam: GENERAL: Patient is  somnolent today but answering appropriately. Not in obvious distress.  On nasal cannula   Thinly built. HENT: No scleral pallor or icterus. Pupils equally reactive to light. Oral mucosa is moist NECK: is supple, no gross swelling noted. CHEST: Diminished breath sounds on the left side.  Left-sided Pleurx  catheter in place. CVS: S1 and S2 heard, no murmur. Regular rate and rhythm.  ABDOMEN: Soft, non-tender, bowel sounds are present. EXTREMITIES: No edema. CNS: Cranial nerves are intact. Mildly somnolent. Moving all extremities. SKIN: warm and dry without rashes.  Data Review: I have personally reviewed the following is laboratory data and studies,  CBC: Recent Labs  Lab 04/17/2019 1118 04/15/2019 1118 04/14/2019 1534 05/02/19 0238 05/03/19 1133 05/04/19 0813 05/05/19 0425  WBC 24.6*  --  26.5* 28.5* 25.9* 24.7* 24.9*  NEUTROABS 22.4*  --  25.4*  --   --   --   --   HGB 16.9  --  16.2 14.5 14.5 15.2 13.7  HCT 49.9   < > 48.6 42.7 42.8 45.4 40.9  MCV 86.3   < > 89.2 88.6 88.6 89.7 88.7  PLT 477*  --  504* 451* 478* 504* 465*   < > = values in this interval not displayed.   Basic Metabolic Panel: Recent Labs  Lab 04/28/2019 1534 04/09/2019 1932 05/04/19 0813 05/04/19 1848 05/04/19 2230 05/05/19 0425 05/05/19 0725  NA 123*   < > 122* 120* 116* 120* 120*  K  6.6*   < > 6.4* 5.7* 5.4* 5.9* 6.1*  CL 88*   < > 87* 88* 86* 90* 89*  CO2 25   < > 24 23 20* 21* 20*  GLUCOSE 127*   < > 121* 136* 133* 118* 114*  BUN 25*   < > 28* 28* 28* 27* 29*  CREATININE 1.15   < > 1.17 1.09 1.12 1.15 1.14  CALCIUM 8.2*   < > 8.3* 7.9* 7.6* 7.7* 7.8*  MG  --   --  2.8*  --   --  2.6*  --   PHOS 4.1  --   --   --   --   --   --    < > = values in this interval not displayed.   Liver Function Tests: Recent Labs  Lab 04/14/2019 1118 04/09/2019 1534 05/02/19 0238  AST 20 22 23   ALT 68* 58* 49*  ALKPHOS 179* 132* 108  BILITOT 0.5 0.8 0.7  PROT 5.6* 5.8* 5.3*  ALBUMIN 2.3* 2.4* 2.2*   No results for input(s): LIPASE, AMYLASE in the last 168 hours. No results for  input(s): AMMONIA in the last 168 hours. Cardiac Enzymes: No results for input(s): CKTOTAL, CKMB, CKMBINDEX, TROPONINI in the last 168 hours. BNP (last 3 results) No results for input(s): BNP in the last 8760 hours.  ProBNP (last 3 results) No results for input(s): PROBNP in the last 8760 hours.  CBG: Recent Labs  Lab 04/20/2019 1739  GLUCAP 133*   Recent Results (from the past 240 hour(s))  SARS CORONAVIRUS 2 (TAT 6-24 HRS) Nasopharyngeal Nasopharyngeal Swab     Status: None   Collection Time: 04/14/2019  3:43 PM   Specimen: Nasopharyngeal Swab  Result Value Ref Range Status   SARS Coronavirus 2 NEGATIVE NEGATIVE Final    Comment: (NOTE) SARS-CoV-2 target nucleic acids are NOT DETECTED. The SARS-CoV-2 RNA is generally detectable in upper and lower respiratory specimens during the acute phase of infection. Negative results do not preclude SARS-CoV-2 infection, do not rule out co-infections with other pathogens, and should not be used as the sole basis for treatment or other patient management decisions. Negative results must be combined with clinical observations, patient history, and epidemiological information. The expected result is Negative. Fact Sheet for  Patients: SugarRoll.be Fact Sheet for Healthcare Providers: https://www.woods-mathews.com/ This test is not yet approved or cleared by the [place] FDA and  has been authorized for detection and/or diagnosis of SARS-CoV-2 by FDA under an Emergency Use Authorization (EUA). This EUA will remain  in effect (meaning this test can be used) for the duration of the COVID-19 declaration under Section 56 4(b)(1) of the Act, 21 U.S.C. section 360bbb-3(b)(1), unless the authorization is terminated or revoked sooner. Performed at [facility], [place] [address] [place], [place] [address]   Culture, body fluid-bottle     Status: None (Preliminary result)   Collection Time: 05/02/19  4:42 PM   Specimen: Pleura  Result Value Ref Range Status   Specimen Description PLEURAL  Final   Special Requests LEFT DRAINAGE  Final   Culture   Final    NO GROWTH 3 DAYS Performed at [place] [address], [place], [place] [address]    Report Status PENDING  Incomplete  Gram stain     Status: None   Collection Time: 05/02/19  4:42 PM   Specimen: Pleura  Result Value Ref Range Status   Specimen Description PLEURAL  Final   Special Requests LEFT DRAINAGE  Final   Gram Stain   Final    FEW WBC PRESENT, PREDOMINANTLY MONONUCLEAR NO ORGANISMS SEEN Performed at [facility], [address], [place], [place] [address]    Report Status 05/02/2019 FINAL  Final     Studies: No results found.    Flora Lipps, MD  [facility] 05/05/2019

## 2019-05-05 NOTE — Progress Notes (Signed)
Per Dr Tamala Julian, 750cc drained from [facility] cath and sutures removed.

## 2019-05-05 NOTE — Progress Notes (Signed)
Ok with [facility] for pts wife to stay the night.

## 2019-05-05 NOTE — Progress Notes (Signed)
[facility] Progress Note  Subjective: Na + did not improve w/ NS, pt drowsy today, Na down 116  Vitals:   05/05/19 0500 05/05/19 0549 05/05/19 1236 05/05/19 1300  BP:  124/87 100/73 99/76  Pulse:  (!) 105  100  Resp:  18 17   Temp:  99.7 F (37.6 C) 97.6 F (36.4 C) 98.9 F (37.2 C)  TempSrc:  Oral Oral Axillary  SpO2:  100% 94% 96%  Weight: 67.4 kg     Height:        Exam: Gen lethargi No jvd or bruits Chest clear bilat  RRR no MRG Abd soft ntnd no mass or ascites +bs GU normal male MS no joint effusions or deformity Ext trace + pretib edema Neuro as above   Home meds:  - ibuprofen prn/ oxycodone prn/ fentanyl 25 ug /hr/ prn hydrocodone  - prn's/ vitamins/ supplements     UA 2/25 > turbid, prot neg, 1.024    UNa < 10,  Uosm 784 done on 05/02/19    Assessment/ Plan: 1. Hyponatremia - no better w/ saline overnight, Na 116 now. 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 Will start 3% saline at 28 cc/hr. Have d/w primary MD.  2. Lung Ca stage IVB - pt is very ill per his pulmonologist, poor prognosis, pt is DNR now 3. Hyperkalemia - cont Lokelma and renal diet, a little better  Rob Lauralynn Loeb 05/05/2019, 9:01 PM   Recent Labs  Lab 04/30/2019 1534 04/08/2019 1932 05/04/19 0813 05/04/19 1848 05/05/19 0425 05/05/19 0725  K 6.6*   < > 6.4*   < > 5.9* 6.1*  BUN 25*   < > 28*   < > 27* 29*  CREATININE 1.15   < > 1.17   < > 1.15 1.14  CALCIUM 8.2*   < > 8.3*   < > 7.7* 7.8*  PHOS 4.1  --   --   --   --   --   HGB 16.2   < > 15.2  --  13.7  --    < > = values in this interval not displayed.   Inpatient medications: . albuterol  10 mg Nebulization Once  . baclofen  5 mg Oral TID  . docusate sodium  100 mg Oral BID  . enoxaparin (LOVENOX) injection  40 mg Subcutaneous Q24H  . folic acid  1 mg Oral Daily  . hydrocortisone sod succinate (SOLU-CORTEF) inj  50 mg Intravenous Q6H  . multivitamin with minerals  1 tablet Oral Daily  . pantoprazole  40 mg Oral  BID  . sodium zirconium cyclosilicate  10 g Oral TID   . sodium chloride (hypertonic)     albuterol, alum & mag hydroxide-simeth, benzonatate, bisacodyl, ondansetron **OR** ondansetron (ZOFRAN) IV

## 2019-05-05 NOTE — Progress Notes (Signed)
Social Visit  Have been in touch with wife since admission.  Patient not doing well.  Adrenal axis appears to be affected now.  Vaptan to be attempted.  PleurX dressing looks good, I removed sutures, took out 750cc amber fluid, and redressed.  Overall patient frail, not eating, suffering.  Discussed that I am worried Bleu is approaching end of life and won't survive more chemo.   Going to touch base with Dr. Julien Nordmann regarding case to see if we should keep pushing or transition goals.  In interim, wife would like all efforts done short of chest compressions or mechanical ventilation.  DNR order entered.  Erskine Emery MD PCCM

## 2019-05-05 NOTE — Progress Notes (Signed)
CRITICAL VALUE ALERT  Critical Value:  Na 116  Date & Time Notied:  05/05/19 @ 1640  Provider Notified: Dr Louanne Belton  Orders Received/Actions taken: MD paged with results; awaiting new orders

## 2019-05-06 DIAGNOSIS — G893 Neoplasm related pain (acute) (chronic): Secondary | ICD-10-CM

## 2019-05-06 DIAGNOSIS — E875 Hyperkalemia: Principal | ICD-10-CM

## 2019-05-06 DIAGNOSIS — C3492 Malignant neoplasm of unspecified part of left bronchus or lung: Secondary | ICD-10-CM

## 2019-05-06 DIAGNOSIS — Z7189 Other specified counseling: Secondary | ICD-10-CM

## 2019-05-06 DIAGNOSIS — J910 Malignant pleural effusion: Secondary | ICD-10-CM

## 2019-05-06 DIAGNOSIS — E871 Hypo-osmolality and hyponatremia: Secondary | ICD-10-CM

## 2019-05-06 DIAGNOSIS — Z515 Encounter for palliative care: Secondary | ICD-10-CM

## 2019-05-06 LAB — COMPREHENSIVE METABOLIC PANEL
ALT: 26 U/L (ref 0–44)
AST: 16 U/L (ref 15–41)
Albumin: 2.1 g/dL — ABNORMAL LOW (ref 3.5–5.0)
Alkaline Phosphatase: 109 U/L (ref 38–126)
Anion gap: 12 (ref 5–15)
BUN: 34 mg/dL — ABNORMAL HIGH (ref 6–20)
CO2: 17 mmol/L — ABNORMAL LOW (ref 22–32)
Calcium: 7.8 mg/dL — ABNORMAL LOW (ref 8.9–10.3)
Chloride: 88 mmol/L — ABNORMAL LOW (ref 98–111)
Creatinine, Ser: 1.27 mg/dL — ABNORMAL HIGH (ref 0.61–1.24)
GFR calc Af Amer: >60 mL/min (ref >60)
GFR calc non Af Amer: >60 mL/min (ref >60)
Glucose, Bld: 130 mg/dL — ABNORMAL HIGH (ref 70–99)
Potassium: 5.5 mmol/L — ABNORMAL HIGH (ref 3.5–5.1)
Sodium: 117 mmol/L — CL (ref 135–145)
Total Bilirubin: 0.8 mg/dL (ref 0.3–1.2)
Total Protein: 5.3 g/dL — ABNORMAL LOW (ref 6.5–8.1)

## 2019-05-06 LAB — CBC
HCT: 40.5 % (ref 39.0–52.0)
Hemoglobin: 13.8 g/dL (ref 13.0–17.0)
MCH: 30.1 pg (ref 26.0–34.0)
MCHC: 34.1 g/dL (ref 30.0–36.0)
MCV: 88.2 fL (ref 80.0–100.0)
Platelets: 487 10*3/uL — ABNORMAL HIGH (ref 150–400)
RBC: 4.59 MIL/uL (ref 4.22–5.81)
RDW: 12.8 % (ref 11.5–15.5)
WBC: 30.8 10*3/uL — ABNORMAL HIGH (ref 4.0–10.5)
nRBC: 0 % (ref 0.0–0.2)

## 2019-05-06 LAB — GLUCOSE, CAPILLARY: Glucose-Capillary: 154 mg/dL — ABNORMAL HIGH (ref 70–99)

## 2019-05-06 LAB — SODIUM
Sodium: 116 mmol/L — CL (ref 135–145)
Sodium: 116 mmol/L — CL (ref 135–145)
Sodium: 116 mmol/L — CL (ref 135–145)

## 2019-05-06 LAB — PHOSPHORUS: Phosphorus: 4.2 mg/dL (ref 2.5–4.6)

## 2019-05-06 LAB — MAGNESIUM: Magnesium: 2.9 mg/dL — ABNORMAL HIGH (ref 1.7–2.4)

## 2019-05-06 MED ORDER — BACLOFEN 10 MG PO TABS
10.0000 mg | ORAL_TABLET | Freq: Three times a day (TID) | ORAL | Status: DC
  Administered 2019-05-06: 10 mg via ORAL
  Filled 2019-05-06: qty 1

## 2019-05-06 MED ORDER — LORAZEPAM 2 MG/ML IJ SOLN
0.5000 mg | INTRAMUSCULAR | Status: DC | PRN
  Filled 2019-05-06: qty 1

## 2019-05-06 MED ORDER — MORPHINE SULFATE (PF) 2 MG/ML IV SOLN
2.0000 mg | Freq: Once | INTRAVENOUS | Status: AC
  Administered 2019-05-06: 2 mg via INTRAVENOUS
  Filled 2019-05-06: qty 1

## 2019-05-06 MED ORDER — METOCLOPRAMIDE HCL 5 MG/ML IJ SOLN
10.0000 mg | Freq: Four times a day (QID) | INTRAMUSCULAR | Status: DC
  Administered 2019-05-06 – 2019-05-07 (×5): 10 mg via INTRAVENOUS
  Filled 2019-05-06 (×5): qty 2

## 2019-05-06 MED ORDER — POLYETHYLENE GLYCOL 3350 17 G PO PACK
17.0000 g | PACK | Freq: Every day | ORAL | Status: DC
  Administered 2019-05-06: 17 g via ORAL
  Filled 2019-05-06: qty 1

## 2019-05-06 MED ORDER — HYDROMORPHONE HCL 1 MG/ML IJ SOLN
0.5000 mg | INTRAMUSCULAR | Status: DC | PRN
  Administered 2019-05-07: 1 mg via INTRAVENOUS
  Administered 2019-05-07 (×2): 0.5 mg via INTRAVENOUS
  Administered 2019-05-07 (×2): 1 mg via INTRAVENOUS
  Filled 2019-05-06 (×5): qty 1

## 2019-05-06 MED ORDER — ATROPINE SULFATE 1 % OP SOLN
3.0000 [drp] | OPHTHALMIC | Status: DC | PRN
  Filled 2019-05-06: qty 2

## 2019-05-06 MED ORDER — POLYETHYLENE GLYCOL 3350 17 G PO PACK: 17.0000 g | PACK | Freq: Every day | ORAL | Status: DC | PRN

## 2019-05-06 MED ORDER — HALOPERIDOL LACTATE 5 MG/ML IJ SOLN: 1.0000 mg | INTRAMUSCULAR | Status: DC | PRN

## 2019-05-06 MED ORDER — HALOPERIDOL LACTATE 2 MG/ML PO CONC
1.0000 mg | ORAL | Status: DC | PRN
  Filled 2019-05-06: qty 0.5

## 2019-05-06 MED ORDER — HYDROMORPHONE HCL 1 MG/ML IJ SOLN
0.5000 mg | INTRAMUSCULAR | Status: DC | PRN
  Administered 2019-05-06 (×4): 0.5 mg via INTRAVENOUS
  Filled 2019-05-06 (×4): qty 0.5

## 2019-05-06 MED ORDER — SODIUM CHLORIDE 0.9 % IV SOLN
12.5000 mg | Freq: Four times a day (QID) | INTRAVENOUS | Status: DC | PRN
  Filled 2019-05-06: qty 0.5

## 2019-05-06 MED ORDER — LORAZEPAM 2 MG/ML PO CONC: 0.5000 mg | Freq: Four times a day (QID) | ORAL | Status: DC | PRN

## 2019-05-06 MED ORDER — OXYCODONE HCL 5 MG/5ML PO SOLN
10.0000 mg | ORAL | Status: DC | PRN
  Administered 2019-05-06 – 2019-05-07 (×3): 10 mg via ORAL
  Filled 2019-05-06 (×3): qty 10

## 2019-05-06 NOTE — TOC Progression Note (Signed)
Transition of Care [facility]) - Progression Note    Patient Details  Name: George Mullen MRN: [id number] Date of Birth: [birth year]  [facility]) CM/SW Contact  Joaquin Courts, RN Phone Number: 05/06/2019, 2:29 PM  Clinical Narrative:  Family selects [facility] in [place] for services. Referral information faxed to agency.  Spouse expresses that patient will need hospital bed, wheelchair, and 3in1 at home, this was relayed to hospice agency who will make arrangements.  Patient will need to transport home by [facility], patient resides more than 50 miles from hospital and [facility] is able to transport but this will have an out of pocket cost.  [facility] estimates cost to be around 1500$, this was communicated to the spouse and she is in agreement to pay this cost in order to transport patient. CM did attempt to reach out to [place] transport agencies and ambulance service, but unable to find another option due to either being out of network with insurance or not authorized to pick up in [place].  Anticipate dc 3/2 per MD and plan to transport by [facility] with spouse prepared to cover the out of pocket cost.        Expected Discharge Plan: Home w Hospice Care Barriers to Discharge: Continued Medical Work up  Expected Discharge Plan and Services Expected Discharge Plan: [place] Acute Care Choice: Hospice                                         Social Determinants of Health (SDOH) Interventions    Readmission Risk Interventions No flowsheet data found.

## 2019-05-06 NOTE — Progress Notes (Signed)
Pt currently awake states pain 8/10 medicated with Dilaudid and Oxycodone PO. Will cont to monitor. SRP, RN

## 2019-05-06 NOTE — Consult Note (Addendum)
Palliative Care Consult Note  Reason for consult: Goals of care in light of advanced cancer with worsening functional status, hyponatremia, recurrent malignant effusion  Palliative care consult received.  Discussed with Dr. Louanne Belton.  Chart reviewed including personal review of pertinent labs and imaging.  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  He received Keytruda but did not receive other planned carboplatin and Alimta as repeat potassium remained high.  He was transitioned to the ED and admitted with hyperkalemia and hyponatremia.  Since admission, he has not had significant change in his electrolytes and condition has continued to decline.  He was transition to DNR/DNI following conversation with his outpatient pulmonologist, Dr. Tamala Julian.  He was also evaluated by oncology who was in agreement that he is not likely to benefit from further disease modifying therapy.  Is reported to want to transition home with hospice support.  Palliative consulted for goals of care.  I met today with George Mullen in conjunction with several family members, including his wife and parents.  I introduced palliative care as specialized medical care for people living with serious illness. It focuses on providing relief from the symptoms and stress of a serious illness. The goal is to improve quality of life for both the patient and the family.  We discussed the things that are most important to him.  This included his family, particularly his daughters and his first grandchild (51-year-old boy).  He worked in [employer] and his faith has always been important to him as well.  We discussed his recent diagnosis of cancer and continued decline he has had in his nutrition and functional status.  We  reviewed the incurable nature of his disease and my concern that with his continued decline despite medical interventions that we may be approaching end of life regardless of interventions moving forward.  When asked, he feels that this is what his body is telling him as well.  We discussed options for care moving forward including 1) continuation of current interventions and continual reassessment of his situation, 2) transitioning to comfort care with eventual goal of transitioning to residential hospice, or 3) transition home with support of home hospice.  [name] clearly states that he would like to be home, spend as much quality time as follows with his family, and have his symptoms well controlled.  Based upon this, family has decided that they would like to work to transition home with the support of hospice.  We discussed symptom management at home and he has had good results with oxycodone, however, it does not last as long as he is currently prescribed.  We talked about being able to increase both dose and frequency in order to better control his symptoms with the support of hospice.  At this point, he reports being in pain and I ordered for another dose of Dilaudid to be given at this time.  He wants to be awake to speak with family, so I started with 0.5 mg of Dilaudid but this is certainly something that we can adjust as needed to better control his symptoms.  We discussed his prognosis.  With his acute decline and severe hyponatremia that is not corrected despite medical therapies, I anticipate his prognosis is most likely less than 2 weeks.  While his goal is  to be at home with hospice, I also believe he would be appropriate for residential hospice if family determines before discharge that they will not be able to care for him at home.  All other questions answered to the best my ability.  Palliative care to continue to support holistically while he is  inpatient.  -DNR/DNI -Unfortunately, Mr. Dorfman has had drastic progression of his disease and appears to be quickly approaching end-of-life.  Based upon his decline and current situation, I anticipate his prognosis is likely a timeframe of days to weeks.  He certainly would be best served by hospice services moving forward as his goal is comfort and his prognosis is very limited. -Goal is to work to transition home with the support of hospice.  He lives in [place], [place] and they would like to work with [facility].  I will reach out to transition of care team to assist in referral for home hospice.  Additionally, we will need to work on exploring options for transportation for him as I do not anticipate him being able to travel by car. On discharge, would recommend scripts for: - Oxycodone Concentrate 76m/0.66ml: 171m(0.66m9msublingual every 1 hour as needed for pain or shortness of breath: Disp 10m70mLorazepam 2mg/19mconcentrated solution: 0.66mg (69m66ml) 70mingual every 4 hours as needed for anxiety: Disp 10ml - 84mol 2mg/ml s7mtion: 0.66mg (0.2568m subl49mal every 4 hours as needed for agitation or nausea: Disp 10ml  - Atr36me ophthalmic 1% solution: 3 drops sublingual every 4 hours as needed for excessive sectretions: Disp 66ml  Time in76m140 Time out: 1300  Total time: 80 minutes  Greater than 50%  of this time was spent counseling and coordinating care related to the above assessment and plan.  Errin Chewning,Micheline Roughlth PLa [phone]

## 2019-05-06 NOTE — Progress Notes (Addendum)
Pt and wife called me room, parents at bedside. Pt states, " I want to be comfortable, the benefits does not outweigh the disease, the cancer is everywhere at this point. I do not want to be in pain. I want to be comfortable." MD called and texted to made aware. SRP, RN

## 2019-05-06 NOTE — Progress Notes (Signed)
Pt c/o of increase pain, and discomfort in left chest/left fllank. Moaning and groaning. Md called, orders received. SRP, RN

## 2019-05-06 NOTE — Progress Notes (Signed)
Pt more lethargic and restless at times,open eyes to request pain med, moan--left chest/flank area. Medicated with pain med as ordered.  Family tearful and at bedside. FULL Comfort measures, pt telemetry d/c'd, IV stopped. Will continue to monitor. SRP, RN

## 2019-05-06 NOTE — Progress Notes (Signed)
Physical Therapy Discharge Patient Details Name: George Mullen MRN: [id number] DOB: 04/16/1960 Today's Date: 05/06/2019 Time:  -     Patient discharged from PT services secondary to medical decline - will need to re-order PT to resume therapy services.  Please see latest therapy progress note for current level of functioning and progress toward goals.    Progress and discharge plan discussed with patient and/or caregiver: Patient/Caregiver agrees with plan   RN and spouse outside room.  They report pt is not doing well, and spouse is requesting comfort care.  PT to sign off at this time.      Arwa Yero,[name] 05/06/2019, 11:05 AM  Arlyce Dice, DPT Acute Rehabilitation Services Office: [phone]

## 2019-05-06 NOTE — Progress Notes (Addendum)
PROGRESS NOTE  George Mullen YCX:[id number] DOB: 30-Jul-1960 DOA: 04/30/2019 PCP: Glenda Chroman, MD   LOS: 4 days   Brief narrative: As per HPI,  George Mullen a 59 y.o.malewith past medical history significant for stage IVb lung cancer with malignant pleural effusion presented to the hospital withhyperkalemia. Patient had gone to oncology clinic today when he had his blood work which showed hyperkalemia. EKG showed some T wave changes so patient was sent to the ED for further evaluation and treatment. Patient does have history of lung cancer and leftsided pigtail catheter for recurrent malignant pleural effusion. ED Course:In the ED, patient was noted to have potassium of 6.6 with sodium of 123. WBC was elevated at 26.5. Patient appeared to be mildly volume depleted. Patient received temporizing measures including insulin D50 and Lokelma in the ED. He was put on telemetry monitor and was then admitted to the hospital for further evaluation and treatment.   Assessment/Plan:  Principal Problem:   Hyperkalemia Active Problems:   Non-small cell cancer of left lung (HCC)   Malignant pleural effusion   Hyponatremia  Stage IVb lung cancer with malignantpleural effusion with chronic hypoxic respiratory failure on nasal cannula oxygen status post Pleurx catheter. On oxygen by nasal cannula 3 L at home.  Being followed by Dr. Curt Bears as outpatient..  Received chemotherapy-one of the infusions, for the first time in the clinic on 04/20/2019.  Poor prognosis.  Pulmonary has seen the patient yesterday.,  Oncology was also notified and at this time the plan is to proceed with palliative care.  Hyperkalemia with T wave changes on admission .  On presentation. Uncertain etiology so far. Receiving Lokelma but still with persistent hyperkalemia. Nephrology on board.  Normal serum cortisol, low serum osmolality.  Urine osmolality high and urine sodium was low.  Unlikely to be tumor lysis  syndrome due to normal phosphate and uric acid.   Severe hyponatremia .patient received Samsca x1.  On hypertonic  saline.  Sodium still at 116 and has not improved.  Due to possibility of underlying adrenal insufficiency physiological dose of hydrocortisone which started yesterday.  Somnolence with encephalopathy. Better after holding narcotics and sedation.  Leukocytosis. Could be secondary to malignancy.  Trending up.  Was started on physiological dose of hydrocortisone yesterday .Chest x-ray with near complete opacification of the left hemithorax with Pleurx catheter in place.  Leukocytosis has plateaued. Continue to monitor leukocytes.  Fatigue, generalized weakness, debility.   Patient was seen by physical therapy who recommend home health PT on discharge.  Patient is extremely debilitated and deconditioned at this time.  VTE Prophylaxis: Lovenox subcu   Code Status: DNR.  Family Communication: Spoke with the patient's wife today.  At this time, family is leaning towards comfort based care.  Family would like to discuss with palliative care regarding his further options.  Spoke with palliative care regarding consultation.  Disposition Plan:  . Patient is from home . Likely disposition to home with hospice likely 1 to 2 days, palliative care consultation pending . Barriers to discharge: Deteriorating clinical condition, severe electrolyte imbalances including severe hyponatremia,  hyperkalemia,  debility, somnolence  Consultants:  Nephrology  Palliative care  Procedures:  None  Antibiotics:  . None  Anti-infectives (From admission, onward)   None     Subjective: Patient was seen and examined at bedside.  Patient is little more alert than after holding sedatives and narcotics.  Complains of chest pain and cough.  Does not feel well feels extremely fatigued  and deconditioned.  Objective: Vitals:   05/05/19 2120 05/06/19 0537  BP: (!) 95/39 96/79  Pulse: 95 98    Resp:  20  Temp:  98 F (36.7 C)  SpO2: 98% 98%    Intake/Output Summary (Last 24 hours) at 05/06/2019 1218 Last data filed at 05/06/2019 0308 Gross per 24 hour  Intake 101.22 ml  Output 1230 ml  Net -1128.78 ml   Filed Weights   05/04/19 0621 05/05/19 0500 05/06/19 0537  Weight: 65.1 kg 67.4 kg 62.9 kg   Body mass index is 21.08 kg/m.   Physical Exam: GENERAL: Patient is more alert today answering few questions..  On nasal cannula   Thinly built. HENT: No scleral pallor or icterus. Pupils equally reactive to light. Oral mucosa is moist NECK: is supple, no gross swelling noted. CHEST: Diminished breath sounds on the left side.  Left-sided Pleurx  catheter in place. CVS: S1 and S2 heard, no murmur. Regular rate and rhythm.  ABDOMEN: Soft, non-tender, bowel sounds are present. EXTREMITIES: No edema. CNS: Moving all extremities. SKIN: warm and dry without rashes.  Data Review: I have personally reviewed the following is laboratory data and studies,  CBC: Recent Labs  Lab 04/30/2019 1118 04/10/2019 1118 04/09/2019 1534 04/19/2019 1534 05/02/19 0238 05/03/19 1133 05/04/19 0813 05/05/19 0425 05/06/19 0347  WBC 24.6*  --  26.5*   < > 28.5* 25.9* 24.7* 24.9* 30.8*  NEUTROABS 22.4*  --  25.4*  --   --   --   --   --   --   HGB 16.9  --  16.2   < > 14.5 14.5 15.2 13.7 13.8  HCT 49.9   < > 48.6   < > 42.7 42.8 45.4 40.9 40.5  MCV 86.3   < > 89.2   < > 88.6 88.6 89.7 88.7 88.2  PLT 477*  --  504*   < > 451* 478* 504* 465* 487*   < > = values in this interval not displayed.   Basic Metabolic Panel: Recent Labs  Lab 04/18/2019 1534 05/02/2019 1932 05/04/19 0813 05/04/19 0813 05/04/19 1848 05/04/19 1848 05/04/19 2230 05/04/19 2230 05/05/19 0425 05/05/19 0425 05/05/19 0725 05/05/19 0725 05/05/19 1552 05/06/19 0201 05/06/19 0347 05/06/19 0659 05/06/19 1009  NA 123*   < > 122*   < > 120*   < > 116*   < > 120*   < > 120*   < > 116* 116* 117* 116* 116*  K 6.6*   < > 6.4*   < >  5.7*  --  5.4*  --  5.9*  --  6.1*  --   --   --  5.5*  --   --   CL 88*   < > 87*   < > 88*  --  86*  --  90*  --  89*  --   --   --  88*  --   --   CO2 25   < > 24   < > 23  --  20*  --  21*  --  20*  --   --   --  17*  --   --   GLUCOSE 127*   < > 121*   < > 136*  --  133*  --  118*  --  114*  --   --   --  130*  --   --   BUN 25*   < > 28*   < >  28*  --  28*  --  27*  --  29*  --   --   --  34*  --   --   CREATININE 1.15   < > 1.17   < > 1.09  --  1.12  --  1.15  --  1.14  --   --   --  1.27*  --   --   CALCIUM 8.2*   < > 8.3*   < > 7.9*  --  7.6*  --  7.7*  --  7.8*  --   --   --  7.8*  --   --   MG  --   --  2.8*  --   --   --   --   --  2.6*  --   --   --   --   --  2.9*  --   --   PHOS 4.1  --   --   --   --   --   --   --   --   --   --   --   --   --  4.2  --   --    < > = values in this interval not displayed.   Liver Function Tests: Recent Labs  Lab 05/04/2019 1118 04/12/2019 1534 05/02/19 0238 05/06/19 0347  AST 20 22 23 16   ALT 68* 58* 49* 26  ALKPHOS 179* 132* 108 109  BILITOT 0.5 0.8 0.7 0.8  PROT 5.6* 5.8* 5.3* 5.3*  ALBUMIN 2.3* 2.4* 2.2* 2.1*   No results for input(s): LIPASE, AMYLASE in the last 168 hours. No results for input(s): AMMONIA in the last 168 hours. Cardiac Enzymes: No results for input(s): CKTOTAL, CKMB, CKMBINDEX, TROPONINI in the last 168 hours. BNP (last 3 results) No results for input(s): BNP in the last 8760 hours.  ProBNP (last 3 results) No results for input(s): PROBNP in the last 8760 hours.  CBG: Recent Labs  Lab 05/04/2019 1739  GLUCAP 133*   Recent Results (from the past 240 hour(s))  SARS CORONAVIRUS 2 (TAT 6-24 HRS) Nasopharyngeal Nasopharyngeal Swab     Status: None   Collection Time: 04/16/2019  3:43 PM   Specimen: Nasopharyngeal Swab  Result Value Ref Range Status   SARS Coronavirus 2 NEGATIVE NEGATIVE Final    Comment: (NOTE) SARS-CoV-2 target nucleic acids are NOT DETECTED. The SARS-CoV-2 RNA is generally detectable in upper  and lower respiratory specimens during the acute phase of infection. Negative results do not preclude SARS-CoV-2 infection, do not rule out co-infections with other pathogens, and should not be used as the sole basis for treatment or other patient management decisions. Negative results must be combined with clinical observations, patient history, and epidemiological information. The expected result is Negative. Fact Sheet for Patients: SugarRoll.be Fact Sheet for Healthcare Providers: https://www.woods-mathews.com/ This test is not yet approved or cleared by the [place] FDA and  has been authorized for detection and/or diagnosis of SARS-CoV-2 by FDA under an Emergency Use Authorization (EUA). This EUA will remain  in effect (meaning this test can be used) for the duration of the COVID-19 declaration under Section 56 4(b)(1) of the Act, 21 U.S.C. section 360bbb-3(b)(1), unless the authorization is terminated or revoked sooner. Performed at [facility], [address] [place], [place] [address]   Culture, body fluid-bottle     Status: None (Preliminary result)   Collection Time: 05/02/19  4:42 PM   Specimen:  Pleura  Result Value Ref Range Status   Specimen Description PLEURAL  Final   Special Requests LEFT DRAINAGE  Final   Culture   Final    NO GROWTH 4 DAYS Performed at [facility] [address] [place], [place] [address]    Report Status PENDING  Incomplete  Gram stain     Status: None   Collection Time: 05/02/19  4:42 PM   Specimen: Pleura  Result Value Ref Range Status   Specimen Description PLEURAL  Final   Special Requests LEFT DRAINAGE  Final   Gram Stain   Final    FEW WBC PRESENT, PREDOMINANTLY MONONUCLEAR NO ORGANISMS SEEN Performed at [facility], [address] [place], [place] [address]    Report Status 05/02/2019 FINAL  Final     Studies: No results found.    Flora Lipps,  MD  [facility] 05/06/2019

## 2019-05-06 NOTE — Progress Notes (Signed)
HEMATOLOGY-ONCOLOGY PROGRESS NOTE  SUBJECTIVE: The patient has generalized weakness and fatigue.  Reports left-sided chest pain this morning.  Still having some intermittent coughing as well.  No hemoptysis.  He remains on hypertonic saline. No family at bedside at the time my visit.  He has no other complaints this morning.  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[place])   05/03/2019 -  Chemotherapy   The patient had palonosetron (ALOXI) injection 0.25 mg, 0.25 mg, Intravenous,  Once, 1 of 4 cycles Administration: 0.25 mg (04/26/2019) PEMEtrexed (ALIMTA) 900 mg in sodium chloride 0.9 % 100 mL chemo infusion, 500 mg/m2 = 900 mg, Intravenous,  Once, 1 of 6 cycles CARBOplatin (PARAPLATIN) 470 mg in sodium chloride 0.9 % 250 mL chemo infusion, 470 mg (100 % of original dose 465.5 mg), Intravenous,  Once, 1 of 4 cycles Dose modification: 465.5 mg (original dose 465.5 mg, Cycle 1) fosaprepitant (EMEND) 150 mg in sodium chloride 0.9 % 145 mL IVPB, 150 mg, Intravenous,  Once, 1 of 4 cycles Administration: 150 mg (04/25/2019) pembrolizumab (KEYTRUDA) 200 mg in sodium chloride 0.9 % 50 mL chemo infusion, 200 mg, Intravenous, Once, 1 of 6 cycles Administration: 200 mg (04/19/2019)  for chemotherapy treatment.       REVIEW OF SYSTEMS:   Constitutional: Denies fevers, chills  Respiratory: He has cough and some shortness of breath.  No hemoptysis Cardiovascular: Reports intermittent left-sided chest pain Gastrointestinal:  Denies nausea, heartburn or change in bowel habits Skin: Denies abnormal skin rashes Lymphatics: Denies new lymphadenopathy or easy bruising Neurological:Denies numbness, tingling or new weaknesses Behavioral/Psych: Mood is stable, no new changes  Extremities: No lower extremity edema All other systems were reviewed with the patient and are negative.  I have reviewed the past medical history,  past surgical history, social history and family history with the patient and they are unchanged from previous note.   PHYSICAL EXAMINATION: ECOG PERFORMANCE STATUS: 2 - Symptomatic, <50% confined to bed  Vitals:   05/05/19 2120 05/06/19 0537  BP: (!) 95/39 96/79  Pulse: 95 98  Resp:  20  Temp:  98 F (36.7 C)  SpO2: 98% 98%   Filed Weights   05/04/19 0621 05/05/19 0500 05/06/19 0537  Weight: 143 lb 8.3 oz (65.1 kg) 148 lb 9.4 oz (67.4 kg) 138 lb 10.7 oz (62.9 kg)    Intake/Output from previous day: 02/28 0701 - 03/01 0700 In: 351.2 [P.O.:250; I.V.:101.2] Out: 1230 [Urine:480; Chest Tube:750]  GENERAL: Chronically ill-appearing male, no distress LUNGS: Diminished breath sounds on the left, Pleurx catheter in place HEART: regular rate & rhythm and no murmurs and no lower extremity edema ABDOMEN:abdomen soft, non-tender and normal bowel sounds Musculoskeletal:no cyanosis of digits and no clubbing  NEURO: alert & oriented x 3.  Slow to respond at times  LABORATORY DATA:  I have reviewed the data as listed CMP Latest Ref Rng & Units 05/06/2019 05/06/2019 05/06/2019  Glucose 70 - 99 mg/dL - 130(H) -  BUN 6 - 20 mg/dL - 34(H) -  Creatinine 0.61 - 1.24 mg/dL - 1.27(H) -  Sodium 135 - 145 mmol/L 116(LL) 117(LL) 116(LL)  Potassium 3.5 - 5.1 mmol/L - 5.5(H) -  Chloride 98 - 111 mmol/L - 88(L) -  CO2 22 - 32 mmol/L - 17(L) -  Calcium 8.9 - 10.3 mg/dL - 7.8(L) -  Total Protein 6.5 - 8.1 g/dL - 5.3(L) -  Total Bilirubin 0.3 -  1.2 mg/dL - 0.8 -  Alkaline Phos 38 - 126 U/L - 109 -  AST 15 - 41 U/L - 16 -  ALT 0 - 44 U/L - 26 -    Lab Results  Component Value Date   WBC 30.8 (H) 05/06/2019   HGB 13.8 05/06/2019   HCT 40.5 05/06/2019   MCV 88.2 05/06/2019   PLT 487 (H) 05/06/2019   NEUTROABS 25.4 (H) 04/22/2019    DG Chest 1 View  Result Date: 04/23/2019 CLINICAL DATA:  Chest tube EXAM: CHEST  1 VIEW COMPARISON:  04/23/2019 FINDINGS: Left chest tube in place. Continued complete  opacification of the left hemithorax. Patchy opacity in the right lower lung. Heart likely normal size. No acute bony abnormality. IMPRESSION: Interval placement of left chest tube with continued complete opacification of the left hemithorax. Right basilar opacity concerning for pneumonia. Electronically Signed   By: Rolm Baptise M.D.   On: 04/23/2019 08:55   CT Angio Chest PE W and/or Wo Contrast  Result Date: 04/21/2019 CLINICAL DATA:  Shortness of breath and near complete opacification of the left hemithorax. EXAM: CT ANGIOGRAPHY CHEST WITH CONTRAST TECHNIQUE: Multidetector CT imaging of the chest was performed using the standard protocol during bolus administration of intravenous contrast. Multiplanar CT image reconstructions and MIPs were obtained to evaluate the vascular anatomy. CONTRAST:  38mL OMNIPAQUE IOHEXOL 350 MG/ML SOLN COMPARISON:  Chest x-ray from earlier in the same day as well as a CT from 04/07/2019 FINDINGS: Cardiovascular: Thoracic aorta demonstrates a normal branching pattern without aneurysmal dilatation or dissection. No cardiac enlargement is seen. The pulmonary artery is well visualized with a normal branching pattern. Some attenuation of the left upper lobe branches are seen related to the underlying disease process. No definitive filling defect to suggest pulmonary embolism is seen. Mediastinum/Nodes: Thoracic inlet is within normal limits. Scattered small mediastinal lymph nodes are again identified. More prominent soft tissue density is noted in the subcarinal region when compared with the prior exam consistent with adenopathy. This measures approximately 2 cm in dimension. The esophagus as visualized appears within normal limits. Lungs/Pleura: Large left-sided pleural effusion is seen. Considerable consolidation in the upper and lower lobes is noted. Density difference is noted in the left perihilar region consistent with underlying mass measuring approximately 5.7 x 5.5 cm. This  has enlarged in size when compared with the prior exam at which time it measured 4.5 x 3.2 cm. No definitive pleural enhancement is seen. As described above there is splaying of the upper lobe pulmonary arterial branches identified. Upper Abdomen: Visualized upper abdomen is within normal limits. Musculoskeletal: Bony structures show mild compression deformity of T11 stable from the prior exam. Review of the MIP images confirms the above findings. IMPRESSION: No evidence of pulmonary embolism. Significant increase in left-sided pleural effusion consistent with the underlying neoplasm. The central left perihilar lesion has increased in size as described above. Associated consolidation is noted as well. Prominent soft tissue density in the subcarinal region is noted consistent with lymphadenopathy. Electronically Signed   By: Inez Catalina M.D.   On: 04/21/2019 14:10   MR BRAIN W WO CONTRAST  Result Date: 04/11/2019 CLINICAL DATA:  Metastatic lung cancer. Head trauma with headache. Rule out metastatic disease. EXAM: MRI HEAD WITHOUT AND WITH CONTRAST TECHNIQUE: Multiplanar, multiecho pulse sequences of the brain and surrounding structures were obtained without and with intravenous contrast. CONTRAST:  65mL GADAVIST GADOBUTROL 1 MMOL/ML IV SOLN COMPARISON:  None. FINDINGS: Brain: Ventricle size normal. Negative for  infarct, hemorrhage, mass. No fluid collection or midline shift. Normal enhancement.  Negative for metastatic disease. Vascular: Normal arterial flow voids. Skull and upper cervical spine: No focal skeletal lesion. Sinuses/Orbits: Mucosal edema paranasal sinuses.  Normal orbit none Other: Mild motion degraded study. IMPRESSION: Negative MRI brain with contrast.  Negative for metastatic disease. Electronically Signed   By: Franchot Gallo M.D.   On: 04/11/2019 19:10   NM PET Image Initial (PI) Skull Base To Thigh  Result Date: 04/10/2019 CLINICAL DATA:  Initial treatment strategy for recently diagnosed  left upper lobe non-small cell lung cancer. EXAM: NUCLEAR MEDICINE PET SKULL BASE TO THIGH TECHNIQUE: 7.7 mCi F-18 FDG was injected intravenously. Full-ring PET imaging was performed from the skull base to thigh after the radiotracer. CT data was obtained and used for attenuation correction and anatomic localization. Fasting blood glucose: 106 mg/dl COMPARISON:  03/20/2019 chest CT. FINDINGS: Mediastinal blood pool activity: SUV max 3.1 Liver activity: SUV max NA NECK: No enlarged or hypermetabolic lymph nodes in the neck. Curvilinear hypermetabolism in a right scalene muscle is probably activity related. Incidental CT findings: Mucous retention cyst versus polyp in the inferior right maxillary sinus. Nonhypermetabolic 2.7 cm right thyroid nodule. CHEST: Infiltrative hypermetabolic 8.2 x 5.1 cm central left upper lobe lung mass (series 8/image 32), contiguous with the left hilum, with max SUV 13.4. Additional hypermetabolic left upper lobe pulmonary nodules measuring 2.6 cm peripherally with max SUV 9.1 (series 8/image 26) and 1.7 cm inferiorly with max SUV 7.8 (series 8/image 39). Right lower lobe 0.4 cm solid pulmonary nodule is below PET resolution (series 8/image 31). Enlarged hypermetabolic 1.3 cm subcarinal node with max SUV 10.1 (series 4/image 75). Left hilar nodal hypermetabolism with max SUV 8.1. Hypermetabolic 0.9 cm AP window node with max SUV 5.7 (series 4/image 70). Left paraspinal focus of hypermetabolism at the T12 level with max SUV 4.9, without discrete CT correlate. Incidental CT findings: Small dependent left pleural effusion. Moderate centrilobular emphysema. ABDOMEN/PELVIS: No abnormal hypermetabolic activity within the liver, pancreas, adrenal glands, or spleen. No hypermetabolic lymph nodes in the abdomen or pelvis. Incidental CT findings: none SKELETON: Hypermetabolic lytic 0.9 cm right iliac bone lesion with max SUV 7.9 (series 4/image 164). No additional skeletal foci of hypermetabolism.  Incidental CT findings: none IMPRESSION: 1. Infiltrative hypermetabolic 8.2 cm central left upper lobe lung mass, contiguous with the left hilum, compatible with primary bronchogenic carcinoma. 2. Two additional hypermetabolic left upper lobe pulmonary nodules, compatible with pulmonary metastases to the same lobe. Right lower lobe 0.4 cm pulmonary nodule is below PET resolution and warrants attention on follow-up chest CT in 3 months. 3. Small dependent left pleural effusion. Nonspecific left T12 paraspinal focus of hypermetabolism without discrete CT correlate, cannot exclude paraspinal/extreme medial basilar left pleural metastasis. 4. Hypermetabolic left hilar, subcarinal and AP window nodal metastases. 5. 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 6. PET-CT staging is Stage IVA (T4 N2 M1b). 7.  Emphysema (ICD10-J43.9). Electronically Signed   By: Ilona Sorrel M.D.   On: 04/10/2019 09:59   DG Chest Port 1 View  Result Date: 04/12/2019 CLINICAL DATA:  Pleural effusion. EXAM: PORTABLE CHEST 1 VIEW COMPARISON:  04/23/2019 FINDINGS: There is near complete opacification of the left hemithorax which has progressed since the prior study. A left-sided PleurX catheter is in place. The heart size is poorly evaluated. There is no obvious pneumothorax. No convincing right-sided pleural effusion. Left sided volume loss is suspected given the lack of significant mediastinal shift.  IMPRESSION: Near complete opacification of the left hemithorax consistent with a large left-sided pleural effusion. A left-sided PleurX catheter is noted. Electronically Signed   By: Constance Holster M.D.   On: 04/14/2019 20:56   DG CHEST PORT 1 VIEW  Result Date: 04/23/2019 CLINICAL DATA:  Pleural effusion. EXAM: PORTABLE CHEST 1 VIEW COMPARISON:  Radiograph yesterday. CT 04/21/2019 FINDINGS: Complete opacification of the left hemithorax with decreased aeration of the upper lobe since yesterday. Suggestion of  increasing effusion with slight rightward tracheal deviation and increased conspicuity of the right heart border. Mild atelectasis at the right lung base. No visualized pneumothorax. IMPRESSION: Complete opacification of the left hemithorax with decreased aeration of the upper lobe since yesterday. Suggestion of increasing effusion with rightward tracheal deviation and increased conspicuity of the right heart border. Electronically Signed   By: Keith Rake M.D.   On: 04/23/2019 05:43   DG CHEST PORT 1 VIEW  Result Date: 04/22/2019 CLINICAL DATA:  Status post thoracentesis EXAM: PORTABLE CHEST 1 VIEW COMPARISON:  04/22/2019 FINDINGS: Cardiac shadow is obscured by the large left pleural effusion. Only minimal aeration of the left upper lobe is noted. Continued opacification of the left hemithorax is seen consistent with the large effusion and underlying mass lesion. Right lung demonstrates mild right basilar atelectasis. No pneumothorax is seen. IMPRESSION: Stable appearance of the chest. Electronically Signed   By: Inez Catalina M.D.   On: 04/22/2019 11:42   DG CHEST PORT 1 VIEW  Result Date: 04/22/2019 CLINICAL DATA:  Pleural effusion. EXAM: PORTABLE CHEST 1 VIEW COMPARISON:  Radiograph and CT yesterday FINDINGS: Large left pleural effusion with near complete opacification the left hemithorax. Minimal portion of the left upper lobe is aerated. Mediastinal contours are obscured. Minimal patchy right lung base opacities likely atelectasis. No evidence of pulmonary edema. No pneumothorax. IMPRESSION: Large left pleural effusion with near complete opacification of the left hemithorax. Minimal portion of the left upper lobe aerated. Minimal right lung base atelectasis. Electronically Signed   By: Keith Rake M.D.   On: 04/22/2019 04:39   DG CHEST PORT 1 VIEW  Result Date: 04/21/2019 CLINICAL DATA:  Status post LEFT thoracentesis. EXAM: PORTABLE CHEST 1 VIEW COMPARISON:  Plain film from earlier same  day. FINDINGS: Persistent near complete opacification of the LEFT hemithorax. RIGHT lung is clear. No pneumothorax seen. IMPRESSION: Persistent near complete opacification of the LEFT hemithorax. No pneumothorax seen. Electronically Signed   By: Franki Cabot M.D.   On: 04/21/2019 17:37   DG Chest Port 1 View  Result Date: 04/21/2019 CLINICAL DATA:  Pt with hx of lung ca, reports right rib pain/chest pain and cough. EXAM: PORTABLE CHEST 1 VIEW COMPARISON:  Chest radiograph 04/05/2019 FINDINGS: The visualized cardiomediastinal contours are stable. There is near complete opacification of the left hemithorax likely secondary to atelectasis and pleural effusion. Right lung is clear. No acute finding in the visualized skeleton. IMPRESSION: Near complete opacification of the left hemithorax likely secondary to atelectasis and pleural effusion. Electronically Signed   By: Audie Pinto M.D.   On: 04/21/2019 12:44    ASSESSMENT AND PLAN: This is a pleasant 59 year old male with stage IVb non-small cell lung cancer who presented with a large left upper lobe lung mass in addition to left hilar and mediastinal lymphadenopathy and metastatic nodules in the left upper lobe as well as right upper lobe and bone metastasis of the right iliac crest diagnosed in February 2021.  The patient was seen by medical oncology on the  day of admission and was found to have significant hyperkalemia which prompted evaluation in the emergency room and subsequent admission.  He received Keytruda only in our office.  However, he was scheduled to receive carboplatin, Alimta, and Keytruda.  The patient has had continued decline in his functional status and now with significant hyponatremia and continued hyperkalemia.  He is being followed by nephrology.  He also has leukocytosis without fever; possibly reactive.  Discussed with Dr. Julien Nordmann who agrees with palliative care consult for ongoing discussion of goals of care.  Given his  continued decline in functional status and ongoing electrolyte abnormalities, he would unlikely be able to tolerate any additional treatment at this point.  I note that he is a DNR/DNI and we agree with this.  We will continue to follow.  Please call medical oncology for questions.    LOS: 4 days   Mikey Bussing, DNP, AGPCNP-BC, AOCNP 05/06/19

## 2019-05-06 NOTE — Progress Notes (Signed)
I responded to spiritual care consult. Pt was alert but in pain. Wife and daughters were at bedside. After Doctor consulted with family, I offered pastoral support in offering space to voice concerns and needs. Wife requested prayer. I offered emotional support, words of comfort, empathic listening, prayer. Wife said she was looking forward to bringing Oakley home to be with the rest of the family. Also, she said they are all Christians and believe in prayer for comfort. Family expressed their gratitude and appreciation for the professionalism of the staff and Chaplain care. I will follow up as needed.   Palliative care Chaplain Resident Fidel Levy  [phone]

## 2019-05-06 NOTE — Progress Notes (Signed)
Pt has been administered pain med as ordered, wife and daughters at bedside, Spiritual consult placed, wife and daughters tearful, sobbing. Talked with family to provide support. Pt comfortable last check. Will continue to monitor pain. SRP, RN

## 2019-05-06 NOTE — Progress Notes (Signed)
The chart reviewed including palliative care note. Discussed with primary team Dr. Louanne Belton.  Patient is DNR/DNI and now waiting to go to hospice.  Currently on pain management and arrangement for transportation to the hospice center. I recommend to discontinue hypertonic saline as patient is mainly comfort/hospice care.   Please call back with question.

## 2019-05-06 NOTE — Progress Notes (Signed)
Called to floor by RN due to status change.  Patient is more lethargic, some apneic periods noted, and cool and clammy extremities.  Discussed with wife who reports lots of anxiety for Mr. Parekh associated with this change.    He appears more calm and comfortable at this point.  Discussed plan for comfort measures.  Still hopeful for transition home with hospice, but discussed with wife that he may have hospital death.  Attending and/or palliative provider to assess stability for transport tomorrow AM.  Micheline Rough, MD [facility] [phone]  NO CHARGE NOTE

## 2019-05-06 NOTE — Progress Notes (Signed)
Family remain at bedside. Pt resting at present time without distress. Will continue to monitor. SPP, RN

## 2019-05-06 NOTE — Progress Notes (Signed)
CRITICAL VALUE ALERT  Critical Value:  Na 116  Date & Time Notied:  05/06/19 @ 310  Provider Notified: X. Blount  Orders Received/Actions taken: MD paged with results; awaiting response

## 2019-05-06 DEATH — deceased

## 2019-05-07 ENCOUNTER — Inpatient Hospital Stay: Payer: Managed Care, Other (non HMO) | Attending: Physician Assistant | Admitting: Physician Assistant

## 2019-05-07 ENCOUNTER — Inpatient Hospital Stay: Payer: Managed Care, Other (non HMO)

## 2019-05-07 DIAGNOSIS — R52 Pain, unspecified: Secondary | ICD-10-CM

## 2019-05-07 DIAGNOSIS — R0602 Shortness of breath: Secondary | ICD-10-CM

## 2019-05-07 LAB — CULTURE, BODY FLUID W GRAM STAIN -BOTTLE: Culture: NO GROWTH

## 2019-05-07 MED ORDER — HYDROMORPHONE BOLUS VIA INFUSION
0.5000 mg | INTRAVENOUS | Status: DC | PRN
  Administered 2019-05-07 (×6): 0.5 mg via INTRAVENOUS
  Filled 2019-05-07: qty 1

## 2019-05-07 MED ORDER — SODIUM CHLORIDE 0.9 % IV SOLN
3.0000 mg/h | INTRAVENOUS | Status: DC
  Administered 2019-05-07: 0.5 mg/h via INTRAVENOUS
  Filled 2019-05-07: qty 5

## 2019-05-07 MED ORDER — HYDROMORPHONE HCL 1 MG/ML IJ SOLN
1.0000 mg | INTRAMUSCULAR | Status: DC | PRN
  Administered 2019-05-07: 2 mg via INTRAVENOUS
  Filled 2019-05-07: qty 2

## 2019-05-07 MED ORDER — SCOPOLAMINE 1 MG/3DAYS TD PT72
1.0000 | MEDICATED_PATCH | TRANSDERMAL | Status: DC
  Administered 2019-05-07: 1.5 mg via TRANSDERMAL
  Filled 2019-05-07: qty 1

## 2019-05-07 MED ORDER — MIDAZOLAM 50MG/50ML (1MG/ML) PREMIX INFUSION
0.5000 mg/h | INTRAVENOUS | Status: DC
  Administered 2019-05-07: 0.5 mg/h via INTRAVENOUS
  Filled 2019-05-07: qty 50

## 2019-05-08 ENCOUNTER — Encounter: Payer: Self-pay | Admitting: Internal Medicine

## 2019-05-08 NOTE — Progress Notes (Signed)
40 mg Dilaudid IVPB and 40 mg Versed IVPB wasted in stericycle with Corky Sox, RN as witness.

## 2019-05-08 NOTE — Progress Notes (Signed)
Wedding band left on patient's finger, per wishes from family. Wife, Langley Gauss, verified this with this RN.

## 2019-05-08 NOTE — Progress Notes (Signed)
Pt has been enrolled w/ [employer] for [employer] for $25,000 from 03/08/19- 03/06/20.His copay for Beryle Flock will be $25.

## 2019-05-09 ENCOUNTER — Inpatient Hospital Stay: Payer: Managed Care, Other (non HMO) | Admitting: Pulmonary Disease

## 2019-05-21 ENCOUNTER — Other Ambulatory Visit: Payer: Managed Care, Other (non HMO)

## 2019-05-21 ENCOUNTER — Ambulatory Visit: Payer: Managed Care, Other (non HMO) | Admitting: Internal Medicine

## 2019-05-21 ENCOUNTER — Ambulatory Visit: Payer: Managed Care, Other (non HMO)

## 2019-06-06 NOTE — Progress Notes (Signed)
Pt Dilaudid infusion infusing @ 94ml/hr infusing Dilaudid .5mg , pt resting without distress, continues to use accessory muscles to breath, however pt is less tense and is more relax. Family at beside. Will cont to monitor and assess pain control. SRP, RN

## 2019-06-06 NOTE — Progress Notes (Signed)
Pt continue with rales bil, Dilaudid infusion increased per MD order. Will cont to monitor. SRP, RN

## 2019-06-06 NOTE — Progress Notes (Signed)
Pt restless, and raised in bed clinching to rails, upper congestion (rales ) noted, pt suctioned small amt secretions noted. Pt family at bedside.  Md made aware and pain med administered. SRP, RN

## 2019-06-06 NOTE — Progress Notes (Signed)
Daily Progress Note   Patient Name: George Mullen       Date: 2019-06-02 DOB: 1961/01/18  Age: 59 y.o. MRN#: [id number] Attending Physician: Flora Lipps, MD Primary Care Physician: Glenda Chroman, MD Admit Date: 04/24/2019  Reason for Consultation/Follow-up: Terminal Care  Subjective:  sitting up in bed, is in mild to moderate respiratory distress, using accessory muscles for respiration. Wife, brother and daughter holding vigil at bedside.   See below.   Length of Stay: 5  Current Medications: Scheduled Meds:  . hydrocortisone sod succinate (SOLU-CORTEF) inj  50 mg Intravenous Q6H  . metoCLOPramide (REGLAN) injection  10 mg Intravenous Q6H    Continuous Infusions: . chlorproMAZINE (THORAZINE) IV    . HYDROmorphone      PRN Meds: albuterol, atropine, bisacodyl, chlorproMAZINE (THORAZINE) IV, haloperidol, haloperidol lactate, HYDROmorphone **AND** HYDROmorphone, HYDROmorphone (DILAUDID) injection, ondansetron **OR** ondansetron (ZOFRAN) IV, polyethylene glycol  Physical Exam         Frail and weak Not awake not alert Mild to moderate distress noted Coarse breath sounds S1 S2 Skin warm and dry No edema No coolness no mottling Using accessory muscles of respiration  Vital Signs: BP 101/74   Pulse 96   Temp (!) 97.5 F (36.4 C) (Oral)   Resp 10   Ht 5\' 8"  (1.727 m)   Wt 62.9 kg   SpO2 97%   BMI 21.08 kg/m  SpO2: SpO2: 97 % O2 Device: O2 Device: Nasal Cannula O2 Flow Rate: O2 Flow Rate (L/min): 2 L/min  Intake/output summary:   Intake/Output Summary (Last 24 hours) at 2019-06-02 9150 Last data filed at 06-02-2019 0536 Gross per 24 hour  Intake --  Output 1260 ml  Net -1260 ml   LBM: Last BM Date: 04/30/19 Baseline Weight: Weight: 65 kg Most recent weight:  Weight: 62.9 kg       Palliative Assessment/Data:    Flowsheet Rows     Most Recent Value  Intake Tab  Referral Department  Hospitalist  Unit at Time of Referral  Med/Surg Unit  Palliative Care Primary Diagnosis  Cancer  Date Notified  05/05/19  Palliative Care Type  New Palliative care  Reason for referral  Clarify Goals of Care, Pain, Non-pain Symptom, End of Life Care Assistance  Date of Admission  04/30/2019  Date first seen by Palliative Care  05/06/19  # of days Palliative referral response time  1 Day(s)  # of days IP prior to Palliative referral  4  Clinical Assessment  Palliative Performance Scale Score  30%  Psychosocial & Spiritual Assessment  Palliative Care Outcomes  Patient/Family meeting held?  Yes  Who was at the meeting?  Patient, wife, parents, brother      Patient Active Problem List   Diagnosis Date Noted  . Hyperkalemia 04/13/2019  . Hyponatremia 04/08/2019  . Right-sided chest pain 04/21/2019  . Malignant pleural effusion 04/21/2019  . Hypoxia 04/21/2019  . Non-small cell cancer of left lung ([place]) 04/18/2019  . Goals of care, counseling/discussion 04/18/2019  . Encounter for antineoplastic chemotherapy 04/18/2019  . Encounter for antineoplastic immunotherapy 04/18/2019  . S/P bronchoscopy 04/04/2019  . Cough 04/04/2019  . Abnormal findings on diagnostic imaging of lung 04/04/2019  . Former smoker 04/04/2019    Palliative Care Assessment & Plan   Patient Profile:  Reason for consult: Goals of care in light of advanced cancer with worsening functional status, hyponatremia, recurrent malignant effusion   Briefly, George Mullen is a 59 year old male with past medical history of recently diagnosed stage IV lung cancer with malignant pleural effusion status post Pleurx catheter placement who presented for first round of chemo and was found to have hyperkalemia.  He received Keytruda but did not receive other planned carboplatin and Alimta as repeat  potassium remained high.  He was transitioned to the ED and admitted with hyperkalemia and hyponatremia.  Since admission, he has not had significant change in his electrolytes and condition has continued to decline.  He was transitioned to DNR/DNI following conversation with his outpatient pulmonologist, Dr. Tamala Julian.  He was also evaluated by oncology who was in agreement that he is not likely to benefit from further disease modifying therapy.  Is reported to want to transition home with hospice support.  Palliative consulted and following for goals of care and symptom management.   Assessment:  dyspnea Generalized uncontrolled pain Anxiety Terminal care.  Prognosis appears hours to some very limited number of days.  PPS 10%  Recommendations/Plan:  Dilaudid drip continuous and bolus    D/C ativan as it is having a paradoxical effect  Continue to maximize comfort  Disposition: patient with uncontrolled symptoms, actively dying and rapidly declining. Home is in [place], arrangements being made with [facility].   If patient is with controlled symptoms on the Dilaudid drip in the next 24 hours, will reassess and reevaluate for discharge home with hospice tomorrow am on 3-3. Discussed with family.   End of life signs and symptoms discussed with family.   Goals of Care and Additional Recommendations:  Limitations on Scope of Treatment: Full Comfort Care  Code Status:    Code Status Orders  (From admission, onward)         Start     Ordered   05/05/19 1556  Do not attempt resuscitation (DNR)  Continuous    Question Answer Comment  In the event of cardiac or respiratory ARREST Do not call a "code blue"   In the event of cardiac or respiratory ARREST Do not perform Intubation, CPR, defibrillation or ACLS   In the event of cardiac or respiratory ARREST Use medication by any route, position, wound care, and other measures to relive pain and suffering. May use oxygen,  suction and manual treatment of airway obstruction as needed for comfort.      05/05/19 1555  Code Status History    Date Active Date Inactive Code Status Order ID Comments User Context   04/25/2019 1844 05/05/2019 1555 Full Code [id number]  Flora Lipps, MD Inpatient   04/21/2019 1602 04/24/2019 0144 Full Code [id number]  Donnamae Jude, MD ED   Advance Care Planning Activity       Prognosis:   Hours - Days  Discharge Planning:  Anticipated Hospital Death  Care plan was discussed with  Family at bedside, also discussed with RN.   Thank you for allowing the Palliative Medicine Team to assist in the care of this patient.   Time In: 8.30 Time Out: 9.05 Total Time 35 Prolonged Time Billed  no       Greater than 50%  of this time was spent counseling and coordinating care related to the above assessment and plan.  Loistine Chance, MD  Please contact Palliative Medicine Team phone at [phone] for questions and concerns.

## 2019-06-06 NOTE — Progress Notes (Signed)
Versed infusing started as ordered per (Guardrails pump locked) verified by Vanita Ingles, RN. Family at beside. SRP, RN

## 2019-06-06 NOTE — Plan of Care (Signed)

## 2019-06-06 NOTE — Progress Notes (Signed)
Handoff report complete. Verified IV meds (Guardrails) pump locked at bedside.  Pt resting more comfortably, decrease rales, quiet respirations at times. Pt family at bedside (spouse and daughter). SRP,RN

## 2019-06-06 NOTE — Progress Notes (Addendum)
Family at bedside, pt resting, periods of apnea, tol Dilaudid infusion at present time. Will continue to monitor for sign of discomfort. Versed on hold per conversation with MD, if pt shows signs of restlessness or distress will start infusion at ordered. SRP, RN

## 2019-06-06 NOTE — Progress Notes (Signed)
Pt has received a total of 3 bolus' of Dilaudid .5mg /1mg /hr per guardrails on IV pump(locked). MD updated family request pt remain restless, rails upper chest noted with period of apneic breathing about 4-5 second. Family at bedside will cont to monitor. SRP,RN

## 2019-06-06 NOTE — Progress Notes (Signed)
Continues to rest comfortably with increased Dilaudid infusion. Versed on hold will cont to monitor pt for pain and restlessness. SRP, RN

## 2019-06-06 NOTE — Progress Notes (Signed)
PROGRESS NOTE  George Mullen WUJ:[id number] DOB: 1961/01/14 DOA: 04/23/2019 PCP: Glenda Chroman, MD   LOS: 5 days   Brief narrative: As per HPI,  George Mullen a 59 y.o.malewith past medical history significant for stage IVb lung cancer with malignant pleural effusion presented to the hospital withhyperkalemia. Patient had gone to oncology clinic today when he had his blood work which showed hyperkalemia. EKG showed some T wave changes so patient was sent to the ED for further evaluation and treatment. Patient does have history of lung cancer and leftsided pigtail catheter for recurrent malignant pleural effusion. ED Course:In the ED, patient was noted to have potassium of 6.6 with sodium of 123. WBC was elevated at 26.5. Patient appeared to be mildly volume depleted. Patient received temporizing measures including insulin D50 and Lokelma in the ED. He was put on telemetry monitor and was then admitted to the hospital for further evaluation and treatment.   Assessment/Plan:  Principal Problem:   Hyperkalemia Active Problems:   Non-small cell cancer of left lung (HCC)   Malignant pleural effusion   Hyponatremia  Stage IVb lung cancer with malignantpleural effusion with chronic hypoxic respiratory failure on nasal cannula oxygen status post Pleurx catheter. On oxygen by nasal cannula 3 L at home.  Being followed by Dr. Curt Bears as outpatient..  Received chemotherapy-one of the infusions, for the first time in the clinic on 04/22/2019.  Poor prognosis.  Pulmonary has seen the patient.,  Oncology was also notified and at this time the plan is to proceed with palliative care.  Palliative care has been consulted and the plan is hospice care.  Currently on comfort care in the hospital.  Hyperkalemia with T wave changes on admission .  Received Lokelma with persistent hyperkalemia.  Nephrology was consulted.  Normal serum cortisol, low serum osmolality.  Urine osmolality high and  urine sodium was low.  Unlikely to be tumor lysis syndrome due to normal phosphate and uric acid.   Severe hyponatremia .patient received Samsca x1.  Received hypertonic  saline with minimal improvement.  This has been discontinued at this time for comfort care.  On physiological dose of hydrocortisone at this time.  Leukocytosis.  Likely from malignancy.  Unlikely to be infection.  Received steroids.  Fatigue, generalized weakness, worsening clinical condition.  Comfort care.    VTE Prophylaxis: Lovenox subcu   Code Status: DNR.  Family Communication: I again spoke with the patient's family at bedside.  Patient has been started on Dilaudid drip by palliative care due to being persistently uncomfortable and being in pain on IV Dilaudid as needed.  Palliative care recommended continuation of Dilaudid drip for the next 24 hours.  Disposition Plan:  . Patient is from home . Likely disposition to home with residential hospice if worsening by tomorrow if pain is better controlled/inhospital death, palliative care care on board . Barriers to discharge: On comfort care, on IV Dilaudid drip, residential hospice arrangements  Consultants:  Nephrology  Palliative care  Procedures:  None  Antibiotics:  . None  Anti-infectives (From admission, onward)   None     Subjective: Patient seen and examined at bedside.  Appears to be in distress from pain.  Mildly somnolent.  Objective: Vitals:   05/06/19 0537 05/06/19 1553  BP: 96/79 101/74  Pulse: 98 96  Resp: 20 10  Temp: 98 F (36.7 C) (!) 97.5 F (36.4 C)  SpO2: 98% 97%    Intake/Output Summary (Last 24 hours) at May 23, 2019 1554 Last data  filed at May 25, 2019 0536 Gross per 24 hour  Intake --  Output 1260 ml  Net -1260 ml   Filed Weights   05/04/19 0621 05/05/19 0500 05/06/19 0537  Weight: 65.1 kg 67.4 kg 62.9 kg   Body mass index is 21.08 kg/m.   Physical Exam: GENERAL: Patient in mild to moderate distress from  pain/dyspnea.  On nasal cannula. HENT: No scleral pallor or icterus. Pupils equally reactive to light. Oral mucosa is moist NECK: is supple, no gross swelling noted. CHEST: Diminished breath sounds on the left side.  Left-sided Pleurx  catheter in place. CVS: S1 and S2 heard, no murmur. Regular rate and rhythm.  ABDOMEN: Soft, non-tender, bowel sounds are present. EXTREMITIES: No edema. CNS: Moving all extremities.  Mildly communicative, somnolent at times. SKIN: warm and dry without rashes.  Data Review: I have personally reviewed the following is laboratory data and studies,  CBC: Recent Labs  Lab 05/05/2019 1118 04/30/2019 1118 05/05/2019 1534 04/10/2019 1534 05/02/19 0238 05/03/19 1133 05/04/19 0813 05/05/19 0425 05/06/19 0347  WBC 24.6*  --  26.5*   < > 28.5* 25.9* 24.7* 24.9* 30.8*  NEUTROABS 22.4*  --  25.4*  --   --   --   --   --   --   HGB 16.9  --  16.2   < > 14.5 14.5 15.2 13.7 13.8  HCT 49.9   < > 48.6   < > 42.7 42.8 45.4 40.9 40.5  MCV 86.3   < > 89.2   < > 88.6 88.6 89.7 88.7 88.2  PLT 477*  --  504*   < > 451* 478* 504* 465* 487*   < > = values in this interval not displayed.   Basic Metabolic Panel: Recent Labs  Lab 04/28/2019 1534 04/15/2019 1932 05/04/19 0813 05/04/19 0813 05/04/19 1848 05/04/19 1848 05/04/19 2230 05/04/19 2230 05/05/19 0425 05/05/19 0425 05/05/19 0725 05/05/19 0725 05/05/19 1552 05/06/19 0201 05/06/19 0347 05/06/19 0659 05/06/19 1009  NA 123*   < > 122*   < > 120*   < > 116*   < > 120*   < > 120*   < > 116* 116* 117* 116* 116*  K 6.6*   < > 6.4*   < > 5.7*  --  5.4*  --  5.9*  --  6.1*  --   --   --  5.5*  --   --   CL 88*   < > 87*   < > 88*  --  86*  --  90*  --  89*  --   --   --  88*  --   --   CO2 25   < > 24   < > 23  --  20*  --  21*  --  20*  --   --   --  17*  --   --   GLUCOSE 127*   < > 121*   < > 136*  --  133*  --  118*  --  114*  --   --   --  130*  --   --   BUN 25*   < > 28*   < > 28*  --  28*  --  27*  --  29*  --    --   --  34*  --   --   CREATININE 1.15   < > 1.17   < > 1.09  --  1.12  --  1.15  --  1.14  --   --   --  1.27*  --   --   CALCIUM 8.2*   < > 8.3*   < > 7.9*  --  7.6*  --  7.7*  --  7.8*  --   --   --  7.8*  --   --   MG  --   --  2.8*  --   --   --   --   --  2.6*  --   --   --   --   --  2.9*  --   --   PHOS 4.1  --   --   --   --   --   --   --   --   --   --   --   --   --  4.2  --   --    < > = values in this interval not displayed.   Liver Function Tests: Recent Labs  Lab 04/17/2019 1118 04/25/2019 1534 05/02/19 0238 05/06/19 0347  AST 20 22 23 16   ALT 68* 58* 49* 26  ALKPHOS 179* 132* 108 109  BILITOT 0.5 0.8 0.7 0.8  PROT 5.6* 5.8* 5.3* 5.3*  ALBUMIN 2.3* 2.4* 2.2* 2.1*   No results for input(s): LIPASE, AMYLASE in the last 168 hours. No results for input(s): AMMONIA in the last 168 hours. Cardiac Enzymes: No results for input(s): CKTOTAL, CKMB, CKMBINDEX, TROPONINI in the last 168 hours. BNP (last 3 results) No results for input(s): BNP in the last 8760 hours.  ProBNP (last 3 results) No results for input(s): PROBNP in the last 8760 hours.  CBG: Recent Labs  Lab 04/27/2019 1739 05/06/19 1553  GLUCAP 133* 154*   Recent Results (from the past 240 hour(s))  SARS CORONAVIRUS 2 (TAT 6-24 HRS) Nasopharyngeal Nasopharyngeal Swab     Status: None   Collection Time: 04/20/2019  3:43 PM   Specimen: Nasopharyngeal Swab  Result Value Ref Range Status   SARS Coronavirus 2 NEGATIVE NEGATIVE Final    Comment: (NOTE) SARS-CoV-2 target nucleic acids are NOT DETECTED. The SARS-CoV-2 RNA is generally detectable in upper and lower respiratory specimens during the acute phase of infection. Negative results do not preclude SARS-CoV-2 infection, do not rule out co-infections with other pathogens, and should not be used as the sole basis for treatment or other patient management decisions. Negative results must be combined with clinical observations, patient history, and  epidemiological information. The expected result is Negative. Fact Sheet for Patients: SugarRoll.be Fact Sheet for Healthcare Providers: https://www.woods-mathews.com/ This test is not yet approved or cleared by the [place] FDA and  has been authorized for detection and/or diagnosis of SARS-CoV-2 by FDA under an Emergency Use Authorization (EUA). This EUA will remain  in effect (meaning this test can be used) for the duration of the COVID-19 declaration under Section 56 4(b)(1) of the Act, 21 U.S.C. section 360bbb-3(b)(1), unless the authorization is terminated or revoked sooner. Performed at [facility], [place] [address], [place], [place] [address]   Culture, body fluid-bottle     Status: None   Collection Time: 05/02/19  4:42 PM   Specimen: Pleura  Result Value Ref Range Status   Specimen Description PLEURAL  Final   Special Requests LEFT DRAINAGE  Final   Culture   Final    NO GROWTH 5 DAYS Performed at [facility], [address], [place], [place] [address]  Report Status 31-May-2019 FINAL  Final  Gram stain     Status: None   Collection Time: 05/02/19  4:42 PM   Specimen: Pleura  Result Value Ref Range Status   Specimen Description PLEURAL  Final   Special Requests LEFT DRAINAGE  Final   Gram Stain   Final    FEW WBC PRESENT, PREDOMINANTLY MONONUCLEAR NO ORGANISMS SEEN Performed at [facility], [address] [place], [place] [address]    Report Status 05/02/2019 FINAL  Final     Studies: No results found.    Flora Lipps, MD  [facility] 05/31/2019

## 2019-06-06 NOTE — Progress Notes (Signed)
Decreased urine output noted, will cont to monitor. SRP, RN

## 2019-06-06 NOTE — Significant Event (Signed)
RN paged because pt expired at 2209 hours. Pt was a DNR and comfort care. Death was expected. Death certificate completed and given to RN at desk.  KJKG, NP [facility]

## 2019-06-06 NOTE — Death Summary Note (Addendum)
Death Summary  George Mullen [id number] DOB: 26-Oct-1960 DOA: 05-15-19  PCP: Glenda Chroman, MD PCP/Office notified:   Admit date: 2019-05-15   Date of Death: 2019/05/21  Final Diagnoses:  Principal Problem:   Hyperkalemia Active Problems:   Non-small cell cancer of left lung ([place])   Malignant pleural effusion   Hyponatremia Comfort care  History of present illness:  George Mullen a 59 y.o.malewith past medical history significant for stage IVb lung cancer with malignant pleural effusion presented to the hospital withhyperkalemia. Patient had gone to oncology clinic  when he had his blood work which showed hyperkalemia. EKG showed some T wave changes so patient was sent to the ED for further evaluation and treatment. Patient does have history of lung cancer and leftsided pigtail catheter for recurrent malignant pleural effusion.   In the ED, patient was noted to have potassium of 6.6 with sodium of 123. WBC was elevated at 26.5. Patient appeared to be mildly volume depleted. Patient received temporizing measures including insulin D50 and Lokelma in the ED. Patient was put on telemetry monitor and was then admitted to the hospital for further evaluation and treatment.  Hospital Course:  Following conditions were addressed during hospitalization,   stage IVb lung cancer with malignantpleural effusionwith chronic hypoxic respiratory failure on nasal cannula oxygenstatus post Pleurx catheter.On oxygen by nasal cannula 3 L at home.  Being followed by Dr. Curt Bears as outpatient..  Received chemotherapy-one of the infusions, for the first time in the clinic on 15-May-2019.  Poor prognosis.  Pulmonary saw the patient.,  Oncology was also notified and the  plan was to proceed with palliative care.  Palliative care was called and the plan was hospice care.  Patient was a started on Dilaudid drip in the hospital for intractable pain dyspnea.   Hyperkalemia with abnormal EKG  with T wave changes on admission .  Received Lokelma with persistent hyperkalemia.  Nephrology was consulted.  Normal serum cortisol, low serum osmolality.  Urine osmolality high and urine sodium was low.  Unlikely to be tumor lysis syndrome due to normal phosphate and uric acid.  Patient did have persistent hyperkalemia despite Lokelma.  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  Received hypertonic  saline with minimal improvement.    Subsequently this was discontinued since patient was initiated on comfort care.    He was also given physiological dose of hydrocortisone  Leukocytosis.  Likely from malignancy.  Unlikely to be infection.  Received steroids.  Severe protein calorie malnutrion, present on admission.  Secondary to advanced cancer, poor oral intake.   Fatigue,generalized weakness, worsening clinical condition. Comfort care was initiated   Time: 05-29-07.  Patient was pronounced dead by RN.  Signed:  Murry Khiev  [facility] 05/08/2019, 2:22 PM

## 2019-06-06 NOTE — Progress Notes (Signed)
Pt lethargic and restless, labored breathing, Dilaudid infusion started (locked IV pump) with CN, Marissa Long, RN witness. Pt wife and daughter at bedside.Will continue to monitor. Pain management/Comfort measures pt teaching with family. SRP,RN

## 2019-06-06 DEATH — deceased

## 2019-06-11 ENCOUNTER — Other Ambulatory Visit: Payer: Managed Care, Other (non HMO)

## 2019-06-11 ENCOUNTER — Ambulatory Visit: Payer: Managed Care, Other (non HMO)

## 2019-06-11 ENCOUNTER — Ambulatory Visit: Payer: Managed Care, Other (non HMO) | Admitting: Internal Medicine

## 2019-08-12 LAB — GUARDANT 360

## 2021-09-03 IMAGING — DX DG CHEST 2V
2 series · 2 of 2 positions shown · non-contrast
Comparison: PA and lateral chest 03/07/2019.  CT chest 03/20/2019.

CLINICAL DATA: History of a lung mass.

EXAM:
CHEST - 2 VIEW

[chest pa]
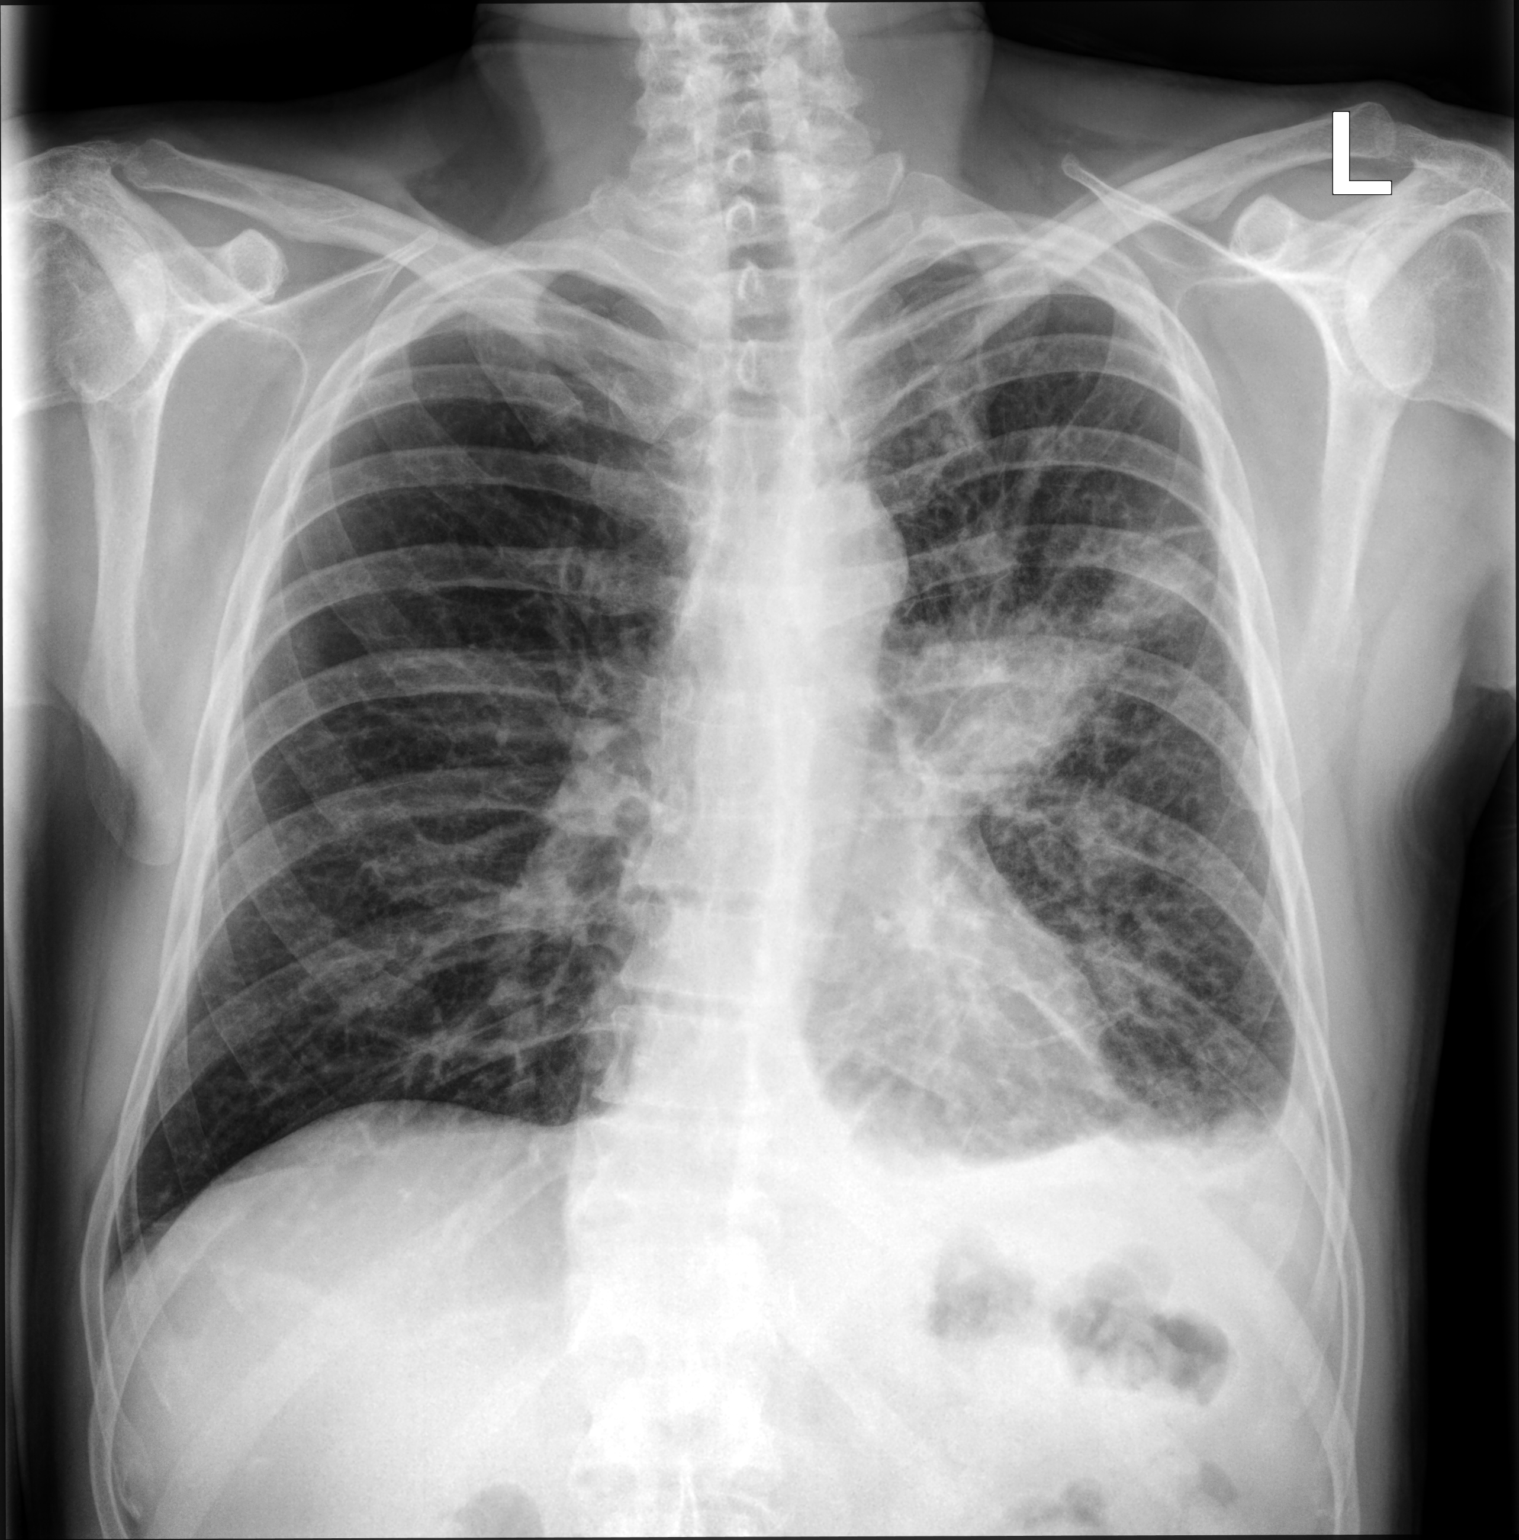

[chest lat]
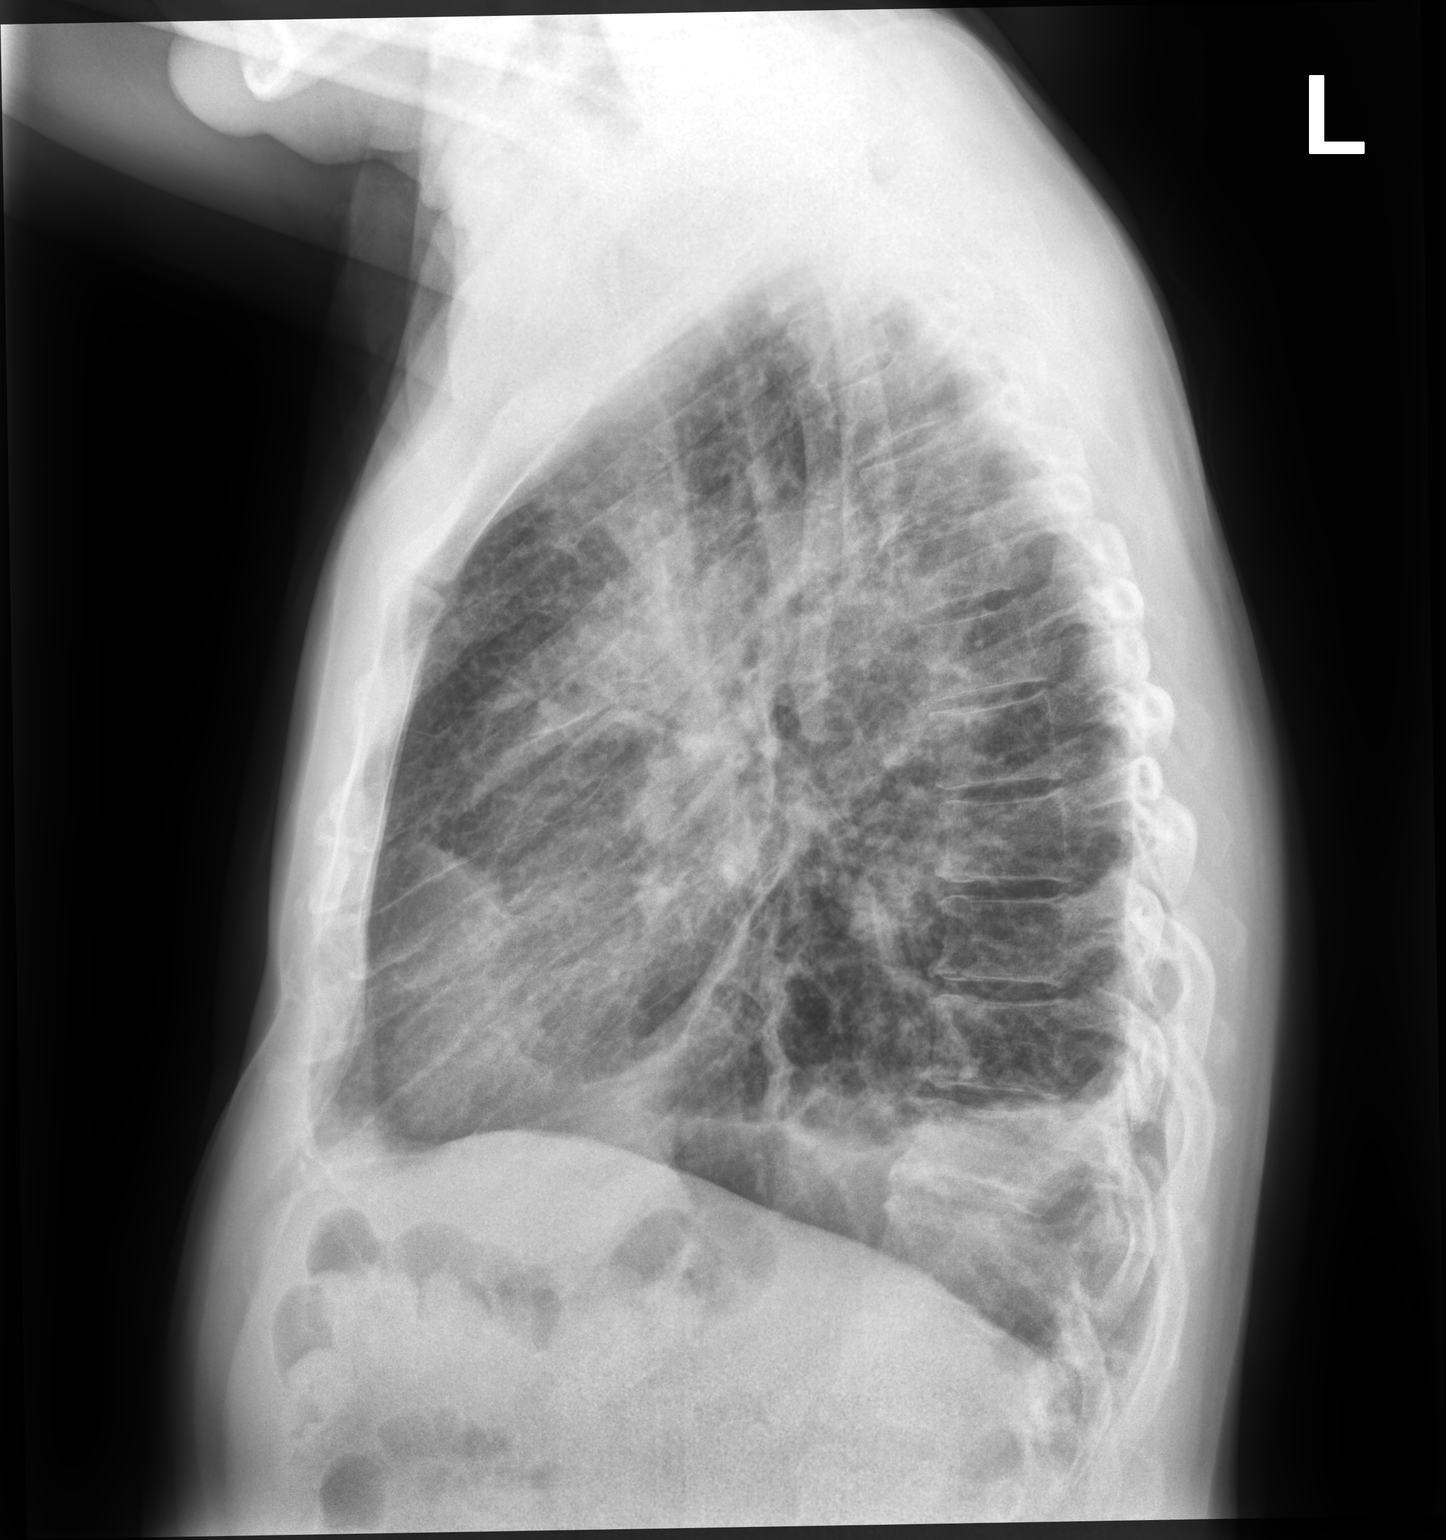

[2 of 2 positions shown; findings below may reference images not displayed]

FINDINGS: Left upper lobe mass lesion appears somewhat larger than on the
prior plain film. There is a new small left pleural effusion. Lungs
are emphysematous. Heart size is normal. No acute bony abnormality.
IMPRESSION: Left upper lobe mass lesion most consistent with carcinoma has
increased in size since the most recent plain film.

New small left pleural effusion.

Emphysema.

## 2021-09-08 IMAGING — CT NM PET TUM IMG INITIAL (PI) SKULL BASE T - THIGH
1 of 8 series · 1 of 25 positions shown · non-contrast
Comparison: 03/20/[address] chest [address].

CLINICAL DATA: Initial treatment strategy for recently diagnosed
left upper lobe non-small cell lung cancer.

EXAM:
NUCLEAR MEDICINE PET SKULL BASE TO THIGH
TECHNIQUE: 7.7 mCi F-18 FDG was injected intravenously. Full-ring PET imaging
was performed from the skull base to thigh after the radiotracer. CT
data was obtained and used for attenuation correction and anatomic
localization.
Fasting blood glucose: 106 mg/dl

[Series 4: ct sk_thigh 5.0 b31f · axial · 5.0mm · 0.98mm/px · 1 of 233 slices shown]
[im 233/233  brain]
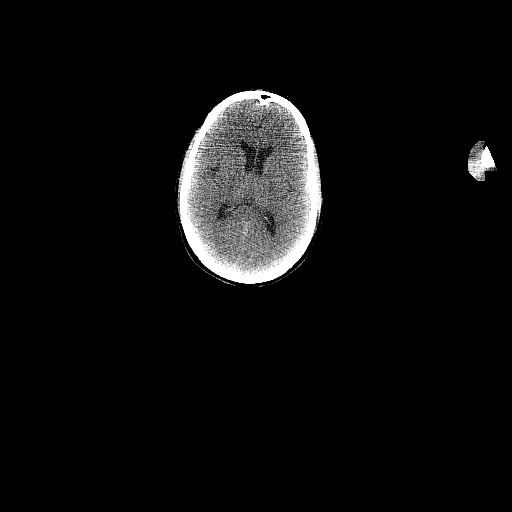

[1 of 25 positions shown; findings below may reference images not displayed]

FINDINGS: Mediastinal blood pool activity: SUV max

Liver activity: SUV max NA

NECK: No enlarged or hypermetabolic lymph nodes in the neck.
Curvilinear hypermetabolism in a right scalene muscle is probably
activity related.

Incidental CT findings: Mucous retention cyst versus polyp in the
inferior right maxillary sinus. Nonhypermetabolic 2.7 cm right
thyroid nodule.

CHEST:

Infiltrative hypermetabolic 8.2 x 5.1 cm central left upper lobe
lung mass (series 8/image 32), contiguous with the left hilum, with
max SUV 13.4.

Additional hypermetabolic left upper lobe pulmonary nodules
measuring 2.6 cm peripherally with max SUV 9.1 (series 8/image 26)
and 1.7 cm inferiorly with max SUV 7.8 (series 8/image 39).

Right lower lobe 0.4 cm solid pulmonary nodule is below PET
resolution (series 8/image 31).

Enlarged hypermetabolic 1.3 cm subcarinal node with max SUV
(series 4/image 75).

Left hilar nodal hypermetabolism with max SUV 8.1.

Hypermetabolic 0.9 cm AP window node with max SUV 5.7 (series
4/image 70).

Left paraspinal focus of hypermetabolism at the T12 level with max
SUV 4.9, without discrete CT correlate.

Incidental CT findings: Small dependent left pleural effusion.
Moderate centrilobular emphysema.

ABDOMEN/PELVIS: No abnormal hypermetabolic activity within the
liver, pancreas, adrenal glands, or spleen. No hypermetabolic lymph
nodes in the abdomen or pelvis.

Incidental CT findings: none

SKELETON: Hypermetabolic lytic 0.9 cm right iliac bone lesion with
max SUV 7.9 (series 4/image 164). No additional skeletal foci of
hypermetabolism.

Incidental CT findings: none
IMPRESSION: 1. Infiltrative hypermetabolic 8.2 cm central left upper lobe lung
mass, contiguous with the left hilum, compatible with primary
bronchogenic carcinoma.
2. Two additional hypermetabolic left upper lobe pulmonary nodules,
compatible with pulmonary metastases to the same lobe. Right lower
lobe 0.4 cm pulmonary nodule is below PET resolution and warrants
attention on follow-up chest CT in 3 months.
3. Small dependent left pleural effusion. Nonspecific left T12
paraspinal focus of hypermetabolism without discrete CT correlate,
cannot exclude paraspinal/extreme medial basilar left pleural
metastasis.
4. Hypermetabolic left hilar, subcarinal and AP window nodal
metastases.
5. Solitary hypermetabolic bone metastasis to the right iliac bone.
6. PET-CT staging is Stage GOMEZ TRUJILLO (T4 N2 M1b).
7.  Emphysema ([id number]).
